# Patient Record
Sex: Male | Born: 1965
Health system: Southern US, Community
[De-identification: ages and names within clinical notes are randomized; demographics above are authoritative.]

## PROBLEM LIST (undated history)

## (undated) DIAGNOSIS — I10 Essential (primary) hypertension: Secondary | ICD-10-CM

## (undated) DIAGNOSIS — M5136 Other intervertebral disc degeneration, lumbar region: Secondary | ICD-10-CM

## (undated) DIAGNOSIS — J45909 Unspecified asthma, uncomplicated: Secondary | ICD-10-CM

## (undated) DIAGNOSIS — E781 Pure hyperglyceridemia: Secondary | ICD-10-CM

## (undated) DIAGNOSIS — G4730 Sleep apnea, unspecified: Secondary | ICD-10-CM

## (undated) DIAGNOSIS — M51369 Other intervertebral disc degeneration, lumbar region without mention of lumbar back pain or lower extremity pain: Secondary | ICD-10-CM

## (undated) DIAGNOSIS — I499 Cardiac arrhythmia, unspecified: Secondary | ICD-10-CM

## (undated) DIAGNOSIS — N200 Calculus of kidney: Secondary | ICD-10-CM

## (undated) DIAGNOSIS — L270 Generalized skin eruption due to drugs and medicaments taken internally: Secondary | ICD-10-CM

## (undated) DIAGNOSIS — E1140 Type 2 diabetes mellitus with diabetic neuropathy, unspecified: Secondary | ICD-10-CM

## (undated) DIAGNOSIS — Z9889 Other specified postprocedural states: Secondary | ICD-10-CM

## (undated) DIAGNOSIS — T7840XA Allergy, unspecified, initial encounter: Secondary | ICD-10-CM

## (undated) DIAGNOSIS — R112 Nausea with vomiting, unspecified: Secondary | ICD-10-CM

## (undated) DIAGNOSIS — F419 Anxiety disorder, unspecified: Secondary | ICD-10-CM

## (undated) DIAGNOSIS — Z87442 Personal history of urinary calculi: Secondary | ICD-10-CM

## (undated) DIAGNOSIS — K219 Gastro-esophageal reflux disease without esophagitis: Secondary | ICD-10-CM

## (undated) DIAGNOSIS — M5126 Other intervertebral disc displacement, lumbar region: Secondary | ICD-10-CM

## (undated) HISTORY — PX: WISDOM TOOTH EXTRACTION: SHX21

## (undated) HISTORY — DX: Other intervertebral disc displacement, lumbar region: M51.26

## (undated) HISTORY — DX: Gastro-esophageal reflux disease without esophagitis: K21.9

## (undated) HISTORY — DX: Sleep apnea, unspecified: G47.30

## (undated) HISTORY — DX: Generalized skin eruption due to drugs and medicaments taken internally: L27.0

## (undated) HISTORY — DX: Essential (primary) hypertension: I10

## (undated) HISTORY — DX: Allergy, unspecified, initial encounter: T78.40XA

## (undated) HISTORY — DX: Anxiety disorder, unspecified: F41.9

## (undated) HISTORY — DX: Pure hyperglyceridemia: E78.1

## (undated) HISTORY — DX: Type 2 diabetes mellitus with diabetic neuropathy, unspecified: E11.40

## (undated) HISTORY — DX: Calculus of kidney: N20.0

---

## 1999-04-18 DIAGNOSIS — E781 Pure hyperglyceridemia: Secondary | ICD-10-CM | POA: Insufficient documentation

## 2001-06-08 ENCOUNTER — Ambulatory Visit (HOSPITAL_BASED_OUTPATIENT_CLINIC_OR_DEPARTMENT_OTHER): Admission: RE | Admit: 2001-06-08 | Discharge: 2001-06-08 | Payer: Self-pay | Admitting: Family Medicine

## 2004-09-22 ENCOUNTER — Ambulatory Visit: Payer: Self-pay | Admitting: Internal Medicine

## 2005-01-03 ENCOUNTER — Ambulatory Visit: Payer: Self-pay | Admitting: Internal Medicine

## 2005-02-14 ENCOUNTER — Ambulatory Visit: Payer: Self-pay | Admitting: Internal Medicine

## 2005-08-14 ENCOUNTER — Ambulatory Visit: Payer: Self-pay | Admitting: Internal Medicine

## 2006-01-14 ENCOUNTER — Ambulatory Visit: Payer: Self-pay | Admitting: Family Medicine

## 2006-01-14 HISTORY — PX: US ECHOCARDIOGRAPHY: HXRAD669

## 2006-01-22 ENCOUNTER — Ambulatory Visit: Payer: Self-pay

## 2006-01-22 ENCOUNTER — Encounter: Payer: Self-pay | Admitting: Cardiology

## 2006-02-20 ENCOUNTER — Ambulatory Visit: Payer: Self-pay | Admitting: Internal Medicine

## 2006-07-25 DIAGNOSIS — K219 Gastro-esophageal reflux disease without esophagitis: Secondary | ICD-10-CM | POA: Insufficient documentation

## 2006-11-13 ENCOUNTER — Encounter (INDEPENDENT_AMBULATORY_CARE_PROVIDER_SITE_OTHER): Payer: Self-pay | Admitting: *Deleted

## 2006-11-28 ENCOUNTER — Ambulatory Visit: Payer: Self-pay | Admitting: Internal Medicine

## 2006-11-28 DIAGNOSIS — J301 Allergic rhinitis due to pollen: Secondary | ICD-10-CM

## 2006-11-28 DIAGNOSIS — F411 Generalized anxiety disorder: Secondary | ICD-10-CM | POA: Insufficient documentation

## 2006-11-28 DIAGNOSIS — J45909 Unspecified asthma, uncomplicated: Secondary | ICD-10-CM | POA: Insufficient documentation

## 2006-11-28 LAB — CONVERTED CEMR LAB
Cholesterol: 193 mg/dL (ref 0–200)
HDL: 30.7 mg/dL — ABNORMAL LOW (ref >39.0)
Total CHOL/HDL Ratio: 6.3
VLDL: 89 mg/dL — ABNORMAL HIGH (ref 0–40)

## 2007-08-19 ENCOUNTER — Ambulatory Visit: Payer: Self-pay | Admitting: Internal Medicine

## 2007-12-03 ENCOUNTER — Ambulatory Visit: Payer: Self-pay | Admitting: Internal Medicine

## 2007-12-03 LAB — CONVERTED CEMR LAB
Albumin: 4.4 g/dL (ref 3.5–5.2)
BUN: 14 mg/dL (ref 6–23)
Basophils Absolute: 0.1 10*3/uL (ref 0.0–0.1)
Basophils Relative: 0.7 % (ref 0.0–3.0)
Calcium: 9.5 mg/dL (ref 8.4–10.5)
Creatinine, Ser: 0.8 mg/dL (ref 0.4–1.5)
Eosinophils Absolute: 0.5 10*3/uL (ref 0.0–0.7)
Eosinophils Relative: 5.5 % — ABNORMAL HIGH (ref 0.0–5.0)
GFR calc Af Amer: 136 mL/min
HCT: 42.3 % (ref 39.0–52.0)
MCHC: 35.5 g/dL (ref 30.0–36.0)
MCV: 87.1 fL (ref 78.0–100.0)
Monocytes Absolute: 0.6 10*3/uL (ref 0.1–1.0)
Phosphorus: 3.6 mg/dL (ref 2.3–4.6)
Platelets: 230 10*3/uL (ref 150–400)
RBC: 4.85 M/uL (ref 4.22–5.81)
WBC: 8.5 10*3/uL (ref 4.5–10.5)

## 2008-07-06 ENCOUNTER — Ambulatory Visit: Payer: Self-pay | Admitting: Family Medicine

## 2008-07-06 DIAGNOSIS — M62838 Other muscle spasm: Secondary | ICD-10-CM | POA: Insufficient documentation

## 2008-07-12 ENCOUNTER — Encounter: Payer: Self-pay | Admitting: Family Medicine

## 2008-07-15 ENCOUNTER — Telehealth: Payer: Self-pay | Admitting: Family Medicine

## 2008-07-15 ENCOUNTER — Encounter: Payer: Self-pay | Admitting: Family Medicine

## 2008-07-30 ENCOUNTER — Encounter: Payer: Self-pay | Admitting: Family Medicine

## 2008-08-05 ENCOUNTER — Encounter: Payer: Self-pay | Admitting: Family Medicine

## 2008-08-10 ENCOUNTER — Ambulatory Visit: Payer: Self-pay | Admitting: Family Medicine

## 2008-08-10 DIAGNOSIS — M546 Pain in thoracic spine: Secondary | ICD-10-CM | POA: Insufficient documentation

## 2008-09-09 ENCOUNTER — Encounter: Payer: Self-pay | Admitting: Family Medicine

## 2008-12-07 ENCOUNTER — Ambulatory Visit: Payer: Self-pay | Admitting: Internal Medicine

## 2008-12-09 LAB — CONVERTED CEMR LAB
Albumin: 4.5 g/dL (ref 3.5–5.2)
Alkaline Phosphatase: 50 units/L (ref 39–117)
BUN: 14 mg/dL (ref 6–23)
Basophils Absolute: 0 10*3/uL (ref 0.0–0.1)
Calcium: 9.8 mg/dL (ref 8.4–10.5)
Creatinine, Ser: 0.9 mg/dL (ref 0.4–1.5)
Eosinophils Absolute: 0.4 10*3/uL (ref 0.0–0.7)
Glucose, Bld: 116 mg/dL — ABNORMAL HIGH (ref 70–99)
Hemoglobin: 15.2 g/dL (ref 13.0–17.0)
Lymphocytes Relative: 31.5 % (ref 12.0–46.0)
MCHC: 34.7 g/dL (ref 30.0–36.0)
Neutro Abs: 4.4 10*3/uL (ref 1.4–7.7)
Neutrophils Relative %: 55.5 % (ref 43.0–77.0)
Platelets: 198 10*3/uL (ref 150.0–400.0)
RDW: 12.5 % (ref 11.5–14.6)
TSH: 1.23 microintl units/mL (ref 0.35–5.50)
Total Bilirubin: 1.1 mg/dL (ref 0.3–1.2)

## 2009-04-14 ENCOUNTER — Encounter: Admission: RE | Admit: 2009-04-14 | Discharge: 2009-04-14 | Payer: Self-pay | Admitting: Internal Medicine

## 2009-04-14 ENCOUNTER — Ambulatory Visit: Payer: Self-pay | Admitting: Internal Medicine

## 2009-05-03 ENCOUNTER — Telehealth: Payer: Self-pay | Admitting: Internal Medicine

## 2009-12-14 ENCOUNTER — Telehealth: Payer: Self-pay | Admitting: Internal Medicine

## 2009-12-14 ENCOUNTER — Ambulatory Visit: Payer: Self-pay | Admitting: Internal Medicine

## 2009-12-14 DIAGNOSIS — B356 Tinea cruris: Secondary | ICD-10-CM

## 2009-12-15 LAB — CONVERTED CEMR LAB
ALT: 36 units/L (ref 0–53)
AST: 24 units/L (ref 0–37)
BUN: 15 mg/dL (ref 6–23)
CO2: 27 meq/L (ref 19–32)
Calcium: 9.7 mg/dL (ref 8.4–10.5)
Chloride: 103 meq/L (ref 96–112)
Eosinophils Relative: 4.2 % (ref 0.0–5.0)
GFR calc non Af Amer: 103.43 mL/min (ref >60)
HCT: 43.9 % (ref 39.0–52.0)
Hemoglobin: 15.4 g/dL (ref 13.0–17.0)
Lymphs Abs: 2.6 10*3/uL (ref 0.7–4.0)
Monocytes Relative: 6.5 % (ref 3.0–12.0)
Neutro Abs: 4.5 10*3/uL (ref 1.4–7.7)
RDW: 12.7 % (ref 11.5–14.6)
TSH: 1.52 microintl units/mL (ref 0.35–5.50)
WBC: 8.1 10*3/uL (ref 4.5–10.5)

## 2010-05-07 ENCOUNTER — Encounter: Payer: Self-pay | Admitting: Family Medicine

## 2010-05-16 NOTE — Progress Notes (Signed)
Summary: Request something for pain  Phone Note Call from Patient Call back at [phone]   Caller: Patient Call For: Joseph Salt MD Summary of Call: Patient saw you several  weeks ago with back pain and wants to know if you would prescribe him some pain managment for this condition. Patient has been taking Ibuprofen and it has caused some adverse reactions and has switched over to acetaminophen and that is not helping manage the pain.  Patient has tried his wife's Tramadol and it has helped some. [facility] Initial call taken by: Sydell Axon LPN,  May 03, 2009 1:05 PM  Follow-up for Phone Call        okay to send Rx for tramadol 50mg   #60 x 0 1 three times a day as needed for back pain If ongoing problems, should schedule appt with me or Dr Patsy Lager Follow-up by: Joseph Salt MD,  May 03, 2009 1:39 PM  Additional Follow-up for Phone Call Additional follow up Details #1::        lmom that rx was sent to pharmacy and that pt should schedule follow-up if needed. Additional Follow-up by: Mervin Hack CMA Duncan Dull),  May 03, 2009 4:12 PM    New/Updated Medications: TRAMADOL HCL 50 MG TABS (TRAMADOL HCL) take 1 by mouth three times a day as needed pain Prescriptions: TRAMADOL HCL 50 MG TABS (TRAMADOL HCL) take 1 by mouth three times a day as needed pain  #60 x 0   Entered by:   Mervin Hack CMA (AAMA)   Authorized by:   Joseph Salt MD   Signed by:   Mervin Hack CMA (AAMA) on 05/03/2009   Method used:   Electronically to        [facility]* (retail)       [address]       [place], [place]  [address]       Ph: [phone]       Fax: [phone]   RxID:   [id number]

## 2010-05-16 NOTE — Assessment & Plan Note (Signed)
Summary: CPX/CLE R/S FROM 12/09/09   Vital Signs:  Patient profile:   45 year old male Height:      73 inches Weight:      228 pounds BMI:     30.19 Temp:     98.3 degrees F oral Pulse rate:   64 / minute Pulse rhythm:   regular BP sitting:   120 / 80  (left arm) Cuff size:   large  Vitals Entered By: Mervin Hack CMA Duncan Dull) (December 14, 2009 10:16 AM) CC: adult physical   History of Present Illness: Doing okay in general  Does get episodic back pain gets relief from tramadol but doesn't use often Uses the cyclobenzaprine is he gets spasm has been trying to do stretches that the PT had taught him  Discussed walking also  Aunt had systemic fungal infection worries because he has recurrent skin problems groin and feet had cleared with lamisil in the past  Sister had been missing for 16 months and was found last fall Her remains had finally been found Suicide form overdose of sleeping pills  Allergies: 1)  ! Bufferin (Aspirin Buf(Cacarb-Mgcarb-Mgo))  Past History:  Past medical, surgical, family and social histories (including risk factors) reviewed for relevance to current acute and chronic problems.  Past Medical History: Reviewed history from 11/28/2006 and no changes required. Hypertriglyceridemia Sleep apnea GERD Fixed drug eruption--11/07   (cold meds?) Allergic rhinitis Anxiety  Past Surgical History: Reviewed history from 07/25/2006 and no changes required. Echo- normal -Holter benign 23-Jan-2023  Family History: Mom died ovarian cancer @66  (2009) Dad had stroke affecting optic nerve 2 sisters-- 1 comitted suicide No CAD,cancer, DM  Social History: Reviewed history from 07/25/2006 and no changes required. 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 Eyes:  Denies double vision  and vision loss-1 eye. ENT:  Complains of ringing in ears; denies decreased hearing; chronic tinnitus teeth okay--sees dentist. CV:  Complains of shortness of breath with exertion; denies chest pain or discomfort, difficulty breathing at night, difficulty breathing while lying down, fainting, lightheadness, and palpitations; no palpitatioins since off NSAIDs. Resp:  Complains of shortness of breath; denies cough; gets exercise induced cough and SOB----will give Rx for inhaler. GI:  Complains of change in bowel habits and indigestion; denies bloody stools, dark tarry stools, nausea, and vomiting; flares of irritable bowel with cramping and diarrhea Protonix controls heartburn. GU:  Denies erectile dysfunction, urinary frequency, and urinary hesitancy. MS:  Complains of low back pain and mid back pain; denies joint pain and joint swelling. Derm:  Complains of rash; denies lesion(s). Neuro:  Complains of headaches; denies numbness, tingling, and weakness; occ migraines---uses excedrin product (no reaction to that). Psych:  Complains of anxiety; denies depression; depression seems better only occ anxiety spells. Heme:  Denies abnormal bruising and enlarge lymph nodes. Allergy:  Denies seasonal allergies and sneezing; uses meds as needed .  Physical Exam  General:  alert and normal appearance.   Eyes:  pupils equal, pupils round, pupils reactive to light, and no optic disk abnormalities.   Ears:  R ear normal and L ear normal.   Mouth:  no erythema, no exudates, and no lesions.   Neck:  supple, no masses, no thyromegaly, no carotid bruits, and no cervical lymphadenopathy.   Lungs:  normal respiratory effort, no intercostal retractions, no  accessory muscle use, and normal breath sounds.   Heart:  normal rate, regular rhythm, no murmur, and no gallop.   Abdomen:  soft and non-tender.   Msk:  no joint tenderness and no joint swelling.   Pulses:  1+ in feet Extremities:  no edema Neurologic:   alert & oriented X3, strength normal in all extremities, and gait normal.   Skin:  no suspicious lesions and no ulcerations.   Mild intertrigo in inguinal area Axillary Nodes:  No palpable lymphadenopathy Psych:  normally interactive, good eye contact, not anxious appearing, and not depressed appearing.     Impression & Recommendations:  Problem # 1:  HEALTH MAINTENANCE EXAM (ICD-V70.0) Assessment Comment Only discussed fitness cancer screening at 50  Problem # 2:  ANXIETY (ICD-300.00) Assessment: Improved doing some better  Problem # 3:  ALLERGIC RHINITIS (ICD-477.9) Assessment: Comment Only does okay with meds  inhaler for before exercise  His updated medication list for this problem includes:    Fexofenadine Hcl 180 Mg Tabs (Fexofenadine hcl) .Marland Kitchen... 1 daily as needed for allergies    Fluticasone Propionate 50 Mcg/act Susp (Fluticasone propionate) .Marland Kitchen... 2 sprays each nostril two times a day for 1 week then daily  Problem # 4:  TINEA CRURIS (ICD-110.3) Assessment: New has had in past did well with brief course of terbenafine  Problem # 5:  GERD (ICD-530.81) Assessment: Comment Only does fine with the med  His updated medication list for this problem includes:    Protonix 40 Mg Tbec (Pantoprazole sodium) .Marland Kitchen... Take 1 tablet by mouth once a day as needed  Orders: TLB-Renal Function Panel (80069-RENAL) TLB-CBC Platelet - w/Differential (85025-CBCD) TLB-Hepatic/Liver Function Pnl (80076-HEPATIC) TLB-TSH (Thyroid Stimulating Hormone) (84443-TSH) Venipuncture (13244)  Complete Medication List: 1)  Protonix 40 Mg Tbec (Pantoprazole sodium) .... Take 1 tablet by mouth once a day as needed 2)  Cyclobenzaprine Hcl 10 Mg Tabs (Cyclobenzaprine hcl) .... 1/2 - 1  tab at bedtime as needed for muscle spasm 3)  Tramadol Hcl 50 Mg Tabs (Tramadol hcl) .... Take 1 by mouth three times a day as needed pain 4)  Fexofenadine Hcl 180 Mg Tabs (Fexofenadine hcl) .Marland Kitchen.. 1 daily as needed for  allergies 5)  Fluticasone Propionate 50 Mcg/act Susp (Fluticasone propionate) .... 2 sprays each nostril two times a day for 1 week then daily 6)  Ventolin Hfa 108 (90 Base) Mcg/act Aers (Albuterol sulfate) .... 2 puffs before exercise or cold exposure 7)  Terbinafine Hcl 250 Mg Tabs (Terbinafine hcl) .Marland Kitchen.. 1 tab daily for 1 week for yeast infection. repeat if needed  Patient Instructions: 1)  Please schedule a follow-up appointment in 1 year.  Prescriptions: TERBINAFINE HCL 250 MG TABS (TERBINAFINE HCL) 1 tab daily for 1 week for yeast infection. Repeat if needed  #28 x 0   Entered and Authorized by:   Cindee Salt MD   Signed by:   Cindee Salt MD on 12/14/2009   Method used:   Electronically to        [facility]* (retail)       [address]       [place], [place]  [address]       Ph: [phone]       Fax: [phone]   RxID:   [id number] VENTOLIN HFA 108 (90 BASE) MCG/ACT AERS (ALBUTEROL SULFATE) 2 puffs before exercise or cold exposure  #1 x 1   Entered and Authorized by:   Cindee Salt MD   Signed by:  Cindee Salt MD on 12/14/2009   Method used:   Electronically to        [facility]* (retail)       [address]       [place], [place]  [address]       Ph: [phone]       Fax: [phone]   RxID:   [id number] TRAMADOL HCL 50 MG TABS (TRAMADOL HCL) take 1 by mouth three times a day as needed pain  #60 x 0   Entered and Authorized by:   Cindee Salt MD   Signed by:   Cindee Salt MD on 12/14/2009   Method used:   Electronically to        [facility]* (retail)       [address]       [place], [place]  [address]       Ph: [phone]       Fax: [phone]   RxID:   [id number] CYCLOBENZAPRINE HCL 10 MG TABS (CYCLOBENZAPRINE HCL) 1/2 - 1  tab at bedtime as needed for muscle spasm  #30 x 1   Entered and Authorized by:   Cindee Salt MD   Signed by:   Cindee Salt MD on 12/14/2009   Method used:    Electronically to        [facility]* (retail)       [address]       [place], [place]  [address]       Ph: [phone]       Fax: [phone]   RxID:   [id number] FLUTICASONE PROPIONATE 50 MCG/ACT  SUSP (FLUTICASONE PROPIONATE) 2 sprays each nostril two times a day for 1 week then daily  #16 Gram x 11   Entered by:   Mervin Hack CMA (AAMA)   Authorized by:   Cindee Salt MD   Signed by:   Mervin Hack CMA (AAMA) on 12/14/2009   Method used:   Electronically to        [facility]* (retail)       [address]       [place], [place]  [address]       Ph: [phone]       Fax: [phone]   RxID:   [id number] PROTONIX 40 MG  TBEC (PANTOPRAZOLE SODIUM) Take 1 tablet by mouth once a day as needed  #30 x 12   Entered by:   Mervin Hack CMA (AAMA)   Authorized by:   Cindee Salt MD   Signed by:   Mervin Hack CMA (AAMA) on 12/14/2009   Method used:   Electronically to        [facility]* (retail)       [address]       [place], [place]  [address]       Ph: [phone]       Fax: [phone]   RxID:   [id number]   Current Allergies (reviewed today): ! BUFFERIN (ASPIRIN BUF(CACARB-MGCARB-MGO))

## 2010-05-16 NOTE — Progress Notes (Signed)
Summary: prior auth needed for terbenafine  Phone Note From Pharmacy   Caller: [facility]* Summary of Call: Prior auth is required for terbinafine, but pt can get this from [facility] $4.00 list for a months supply, or $10.00 for a 3 month supply.  He has opted to go that route.  Please advise on instructions and quantity.  Uses [facility]. Initial call taken by: Lowella Petties CMA,  December 14, 2009 3:42 PM  Follow-up for Phone Call        Please send Rx for #30 x 0 take daily for 1 week for fungal infection. Repeat as needed  Follow-up by: Cindee Salt MD,  December 14, 2009 6:00 PM  Additional Follow-up for Phone Call Additional follow up Details #1::        rx sent to pharmacy, Spoke with patient and advised results.  Additional Follow-up by: Mervin Hack CMA (AAMA),  December 15, 2009 8:20 AM    New/Updated Medications: TERBINAFINE HCL 250 MG TABS (TERBINAFINE HCL) 1 tab daily for 1 week for yeast infection. Repeat if needed Prescriptions: TERBINAFINE HCL 250 MG TABS (TERBINAFINE HCL) 1 tab daily for 1 week for yeast infection. Repeat if needed  #0 x 0   Entered by:   Mervin Hack CMA (AAMA)   Authorized by:   Cindee Salt MD   Signed by:   Mervin Hack CMA (AAMA) on 12/15/2009   Method used:   Electronically to        [facility] (retail)       [address]       [place], [place]  [address]       Ph: [phone]       Fax: [phone]   RxID:   [id number]

## 2011-07-31 ENCOUNTER — Other Ambulatory Visit: Payer: Self-pay

## 2011-07-31 NOTE — Telephone Encounter (Signed)
Spoke with patient and he will buy OTC, pt has no insurance.

## 2011-07-31 NOTE — Telephone Encounter (Signed)
Pt walked in and left written request for refill Fexofenadine HCL 180 mg. with instructions  1 tab by mouth daily as needed. Pt last seen 12/14/09 and no future appt scheduled. Pt uses [facility] and can be reached at [phone].

## 2011-08-09 ENCOUNTER — Other Ambulatory Visit: Payer: Self-pay | Admitting: *Deleted

## 2011-08-09 NOTE — Telephone Encounter (Signed)
Patient last seen 12/14/09 for CPX.  Faxed request from [facility].

## 2011-08-10 MED ORDER — FLUTICASONE PROPIONATE 50 MCG/ACT NA SUSP: 2.0000 | Freq: Every day | NASAL | Status: DC

## 2011-08-10 MED ORDER — TRAMADOL HCL 50 MG PO TABS: 50.0000 mg | ORAL_TABLET | Freq: Three times a day (TID) | ORAL | Status: AC | PRN

## 2011-08-10 NOTE — Telephone Encounter (Signed)
Okay fluticasone for a year and tramadol #30 x 0 Have him schedule a PE in the next few months

## 2011-08-10 NOTE — Telephone Encounter (Signed)
rx refilled as instructed and message left for patient to schedule cpx

## 2012-01-07 ENCOUNTER — Encounter: Payer: Self-pay | Admitting: Internal Medicine

## 2012-01-07 ENCOUNTER — Ambulatory Visit (INDEPENDENT_AMBULATORY_CARE_PROVIDER_SITE_OTHER): Payer: BC Managed Care – PPO | Admitting: Internal Medicine

## 2012-01-07 VITALS — BP 130/90 | HR 73 | Temp 98.6°F | Ht 73.5 in | Wt 233.0 lb

## 2012-01-07 DIAGNOSIS — R200 Anesthesia of skin: Secondary | ICD-10-CM | POA: Insufficient documentation

## 2012-01-07 DIAGNOSIS — B360 Pityriasis versicolor: Secondary | ICD-10-CM

## 2012-01-07 DIAGNOSIS — Z0000 Encounter for general adult medical examination without abnormal findings: Secondary | ICD-10-CM | POA: Insufficient documentation

## 2012-01-07 DIAGNOSIS — E781 Pure hyperglyceridemia: Secondary | ICD-10-CM

## 2012-01-07 DIAGNOSIS — K219 Gastro-esophageal reflux disease without esophagitis: Secondary | ICD-10-CM

## 2012-01-07 DIAGNOSIS — R209 Unspecified disturbances of skin sensation: Secondary | ICD-10-CM

## 2012-01-07 LAB — CBC WITH DIFFERENTIAL/PLATELET
Eosinophils Relative: 4.6 % (ref 0.0–5.0)
HCT: 45.5 % (ref 39.0–52.0)
Lymphs Abs: 3.1 10*3/uL (ref 0.7–4.0)
MCV: 87.8 fl (ref 78.0–100.0)
Monocytes Absolute: 0.7 10*3/uL (ref 0.1–1.0)
Platelets: 222 10*3/uL (ref 150.0–400.0)
RDW: 12.8 % (ref 11.5–14.6)
WBC: 9.8 10*3/uL (ref 4.5–10.5)

## 2012-01-07 MED ORDER — SELENIUM SULFIDE 2.5 % EX LOTN: TOPICAL_LOTION | CUTANEOUS | Status: DC

## 2012-01-07 MED ORDER — PANTOPRAZOLE SODIUM 40 MG PO TBEC: 40.0000 mg | DELAYED_RELEASE_TABLET | Freq: Every day | ORAL | Status: DC | PRN

## 2012-01-07 NOTE — Assessment & Plan Note (Signed)
Non specific Will check labs 

## 2012-01-07 NOTE — Assessment & Plan Note (Signed)
Rx for selsun lotion

## 2012-01-07 NOTE — Assessment & Plan Note (Signed)
Generally healthy but needs to work on fitness Discussed losing weight

## 2012-01-07 NOTE — Progress Notes (Signed)
Subjective:    Patient ID: Joseph Jackson, male    DOB: 02-07-1966, 46 y.o.   MRN: [id number]  HPI "It has been an interesting 2 years"  Almost ruptured tendon at right elbow---seen at urgent care (for worker's comp) Still with some swelling after a year  Palms of hands and arches in feet are sore in AM Hurts at first to walk --improves as time goes by Has sensory loss on tops of feet as well  Has rash across chest and back Concerned about psoriasis (did have prior diagnosis at derm)  Lost voice for 9 months Pain on side of throat Voice would be okay and then fade after a few minutes and be hoarse Coughed or sneezed out some tissue---seemed to get better since then No recent major heartburn issues---uses protonix prn  Still gets periodic back pain Uses ibuprofen but occ uses the tramadol (will tend to do this in spurts)  Current Outpatient Prescriptions on File Prior to Visit  Medication Sig Dispense Refill  . fluticasone (FLONASE) 50 MCG/ACT nasal spray Place 2 sprays into the nose daily.  16 g  11  . pantoprazole (PROTONIX) 40 MG tablet Take 1 tablet (40 mg total) by mouth daily as needed.  30 tablet  11    Allergies  Allergen Reactions  . Aspirin     REACTION: swelling    Past Medical History  Diagnosis Date  . Pure hyperglyceridemia   . Unspecified sleep apnea   . GERD (gastroesophageal reflux disease)   . Dermatitis due to drugs and medicines taken internally   . Allergy   . Anxiety     Past Surgical History  Procedure Date  . US echocardiography 10/07    normal, holter benign     Family History  Problem Relation Age of Onset  . Cancer Neg Hx   . Diabetes Neg Hx   . Heart disease Neg Hx     History   Social History  . Marital Status: Married    Spouse Name: N/A    Number of Children: 2  . 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 Smoking status: Never Smoker   .  Smokeless tobacco: Never Used  . Alcohol Use: Yes  . Drug Use: No  . Sexually Active: Not on file   Other Topics Concern  . Not on file   Social History Narrative  . No narrative on file   Review of Systems  Constitutional: Positive for unexpected weight change. Negative for fatigue.       Weight is up 5# Wears seat belt  HENT: Positive for congestion, rhinorrhea and tinnitus. Negative for hearing loss and dental problem.        Regular with dentist Uses flonase for his allergies. OTC fexofenadine also  Eyes: Negative for visual disturbance.       No diplopia or unilateral vision loss  Respiratory: Positive for shortness of breath. Negative for cough and chest tightness.        Occ has allergic asthma---has left over inhaler. Will use that proactively if exerts in the cold weather  Cardiovascular: Negative for chest pain, palpitations and leg swelling.  Gastrointestinal: Positive for diarrhea and constipation. Negative for nausea, vomiting, abdominal pain and blood in stool.       Some heartburn---see HPI Gets bouts of IBS  Genitourinary: Negative for dysuria, urgency, frequency and difficulty urinating.  No sexual problems  Musculoskeletal: Positive for back pain. Negative for joint swelling and arthralgias.  Skin: Positive for color change. Negative for rash.  Neurological: Positive for numbness. Negative for dizziness, syncope, weakness, light-headedness and headaches.  Hematological: Positive for adenopathy. Does not bruise/bleed easily.       Intermittent cervical nodes  Psychiatric/Behavioral: Positive for disturbed wake/sleep cycle and dysphoric mood. The patient is nervous/anxious.        Has restless sleep at times--snores but no apnea Occ down day with the days getting shorter Does get anxious at times in response to life stressors       Objective:   Physical Exam  Constitutional: He is oriented to person, place, and time. He appears well-developed and  well-nourished. No distress.  HENT:  Head: Normocephalic and atraumatic.  Right Ear: External ear normal.  Left Ear: External ear normal.  Mouth/Throat: Oropharynx is clear and moist. No oropharyngeal exudate.  Eyes: Conjunctivae normal and EOM are normal. Pupils are equal, round, and reactive to light.  Neck: Normal range of motion. Neck supple. No thyromegaly present.  Cardiovascular: Normal rate, regular rhythm, normal heart sounds and intact distal pulses.  Exam reveals no gallop.   No murmur heard. Pulmonary/Chest: Effort normal and breath sounds normal. No respiratory distress. He has no wheezes. He has no rales.  Abdominal: Soft. There is no tenderness.  Musculoskeletal: Normal range of motion. He exhibits no edema and no tenderness.  Lymphadenopathy:    He has no cervical adenopathy.  Neurological: He is alert and oriented to person, place, and time.  Skin: Skin is warm. Rash noted. No erythema. No pallor.       Faint erythematous rash across chest  Psychiatric: He has a normal mood and affect. His behavior is normal. Thought content normal.          Assessment & Plan:

## 2012-01-07 NOTE — Assessment & Plan Note (Signed)
protonix prn  

## 2012-01-08 LAB — BASIC METABOLIC PANEL
BUN: 15 mg/dL (ref 6–23)
Chloride: 105 mEq/L (ref 96–112)
Glucose, Bld: 89 mg/dL (ref 70–99)
Potassium: 4.2 mEq/L (ref 3.5–5.1)

## 2012-01-08 LAB — VITAMIN B12: Vitamin B-12: 506 pg/mL (ref 211–911)

## 2012-01-08 LAB — T4, FREE: Free T4: 0.87 ng/dL (ref 0.60–1.60)

## 2012-01-08 LAB — HEPATIC FUNCTION PANEL
AST: 26 U/L (ref 0–37)
Albumin: 4.9 g/dL (ref 3.5–5.2)
Total Protein: 8 g/dL (ref 6.0–8.3)

## 2012-01-08 LAB — TSH: TSH: 1.58 u[IU]/mL (ref 0.35–5.50)

## 2012-01-14 ENCOUNTER — Encounter: Payer: Self-pay | Admitting: *Deleted

## 2012-02-20 ENCOUNTER — Other Ambulatory Visit: Payer: Self-pay | Admitting: *Deleted

## 2012-02-20 MED ORDER — TRAMADOL HCL 50 MG PO TABS: ORAL_TABLET | ORAL | Status: DC

## 2012-02-20 NOTE — Telephone Encounter (Signed)
Should be tid prn cough #60 x 0

## 2012-02-20 NOTE — Telephone Encounter (Signed)
Medication phoned to [facility] pharmacy as instructed.

## 2012-02-28 ENCOUNTER — Other Ambulatory Visit: Payer: Self-pay | Admitting: *Deleted

## 2012-02-28 NOTE — Telephone Encounter (Signed)
Last filled 02/20/2012, too soon?

## 2012-02-28 NOTE — Telephone Encounter (Signed)
Find out how much he is using Has seen pulmonary for chronic cough that has defied diagnosis Uses this as needed If he is using it regularly, can increase to #90 ----but he still shouldn't have used up the 60 yet  Check with him

## 2012-02-28 NOTE — Telephone Encounter (Signed)
Patient picked up rx yesterday 02/27/2012

## 2012-05-31 ENCOUNTER — Other Ambulatory Visit: Payer: Self-pay

## 2012-12-01 ENCOUNTER — Other Ambulatory Visit: Payer: Self-pay | Admitting: Internal Medicine

## 2012-12-01 NOTE — Telephone Encounter (Signed)
Okay #60 x 0 Due for physical in September Have him schedule within the next 4 months or so

## 2012-12-02 NOTE — Telephone Encounter (Signed)
rx called into pharmacy Pt has CPX on 01/08/13

## 2013-01-08 ENCOUNTER — Encounter: Payer: BC Managed Care – PPO | Admitting: Internal Medicine

## 2013-01-08 DIAGNOSIS — Z0289 Encounter for other administrative examinations: Secondary | ICD-10-CM

## 2013-01-22 ENCOUNTER — Telehealth: Payer: Self-pay | Admitting: Internal Medicine

## 2013-01-22 NOTE — Telephone Encounter (Signed)
That sounds great! She has had a rough time lately, and him also because of that. Joseph Jackson may really be able to help her

## 2013-01-22 NOTE — Telephone Encounter (Signed)
The referral was for Joseph Jackson also! I made his wife an appt and he asked me to refer him also, I will try to ask Terri about a family session with their twin boys also.

## 2013-01-22 NOTE — Telephone Encounter (Signed)
Patient came by with his wife and asked if I could refer him to [facility]ick so I made him an appointment and told him I would let you know. Also he set up an appt to see you soon.

## 2013-01-22 NOTE — Telephone Encounter (Signed)
Okay. Sounds good.

## 2013-02-05 ENCOUNTER — Ambulatory Visit (INDEPENDENT_AMBULATORY_CARE_PROVIDER_SITE_OTHER): Payer: PRIVATE HEALTH INSURANCE | Admitting: Psychology

## 2013-02-05 DIAGNOSIS — F4321 Adjustment disorder with depressed mood: Secondary | ICD-10-CM

## 2013-02-12 ENCOUNTER — Ambulatory Visit (INDEPENDENT_AMBULATORY_CARE_PROVIDER_SITE_OTHER): Payer: PRIVATE HEALTH INSURANCE | Admitting: Psychology

## 2013-02-12 DIAGNOSIS — F4321 Adjustment disorder with depressed mood: Secondary | ICD-10-CM

## 2013-02-26 ENCOUNTER — Ambulatory Visit (INDEPENDENT_AMBULATORY_CARE_PROVIDER_SITE_OTHER): Payer: PRIVATE HEALTH INSURANCE | Admitting: Psychology

## 2013-02-26 DIAGNOSIS — F4321 Adjustment disorder with depressed mood: Secondary | ICD-10-CM

## 2013-03-05 ENCOUNTER — Ambulatory Visit (INDEPENDENT_AMBULATORY_CARE_PROVIDER_SITE_OTHER): Payer: PRIVATE HEALTH INSURANCE | Admitting: Psychology

## 2013-03-05 DIAGNOSIS — F4321 Adjustment disorder with depressed mood: Secondary | ICD-10-CM

## 2013-03-19 ENCOUNTER — Ambulatory Visit: Payer: BC Managed Care – PPO | Admitting: Psychology

## 2013-03-23 ENCOUNTER — Encounter: Payer: BC Managed Care – PPO | Admitting: Internal Medicine

## 2013-03-24 ENCOUNTER — Encounter: Payer: Self-pay | Admitting: Internal Medicine

## 2013-03-24 ENCOUNTER — Ambulatory Visit (INDEPENDENT_AMBULATORY_CARE_PROVIDER_SITE_OTHER): Payer: BC Managed Care – PPO | Admitting: Internal Medicine

## 2013-03-24 VITALS — BP 138/80 | HR 72 | Temp 97.9°F | Wt 232.0 lb

## 2013-03-24 DIAGNOSIS — E781 Pure hyperglyceridemia: Secondary | ICD-10-CM

## 2013-03-24 DIAGNOSIS — J309 Allergic rhinitis, unspecified: Secondary | ICD-10-CM

## 2013-03-24 DIAGNOSIS — K219 Gastro-esophageal reflux disease without esophagitis: Secondary | ICD-10-CM

## 2013-03-24 DIAGNOSIS — Z0000 Encounter for general adult medical examination without abnormal findings: Secondary | ICD-10-CM

## 2013-03-24 LAB — HEPATIC FUNCTION PANEL
ALT: 24 U/L (ref 0–53)
AST: 20 U/L (ref 0–37)
Total Protein: 7.7 g/dL (ref 6.0–8.3)

## 2013-03-24 LAB — CBC WITH DIFFERENTIAL/PLATELET
Basophils Relative: 1.2 % (ref 0.0–3.0)
Eosinophils Absolute: 0.4 10*3/uL (ref 0.0–0.7)
Eosinophils Relative: 4.1 % (ref 0.0–5.0)
HCT: 43.7 % (ref 39.0–52.0)
Hemoglobin: 15 g/dL (ref 13.0–17.0)
Lymphs Abs: 3.1 10*3/uL (ref 0.7–4.0)
MCHC: 34.3 g/dL (ref 30.0–36.0)
MCV: 86 fl (ref 78.0–100.0)
Monocytes Absolute: 0.6 10*3/uL (ref 0.1–1.0)
Neutro Abs: 5.3 10*3/uL (ref 1.4–7.7)
RBC: 5.09 Mil/uL (ref 4.22–5.81)

## 2013-03-24 LAB — LIPID PANEL
Cholesterol: 198 mg/dL (ref 0–200)
Total CHOL/HDL Ratio: 7

## 2013-03-24 LAB — BASIC METABOLIC PANEL
CO2: 26 mEq/L (ref 19–32)
Chloride: 101 mEq/L (ref 96–112)
Creatinine, Ser: 0.9 mg/dL (ref 0.4–1.5)
Potassium: 4.3 mEq/L (ref 3.5–5.1)
Sodium: 134 mEq/L — ABNORMAL LOW (ref 135–145)

## 2013-03-24 LAB — LDL CHOLESTEROL, DIRECT: Direct LDL: 122.5 mg/dL

## 2013-03-24 NOTE — Assessment & Plan Note (Signed)
Needs to work on fitness Will recheck

## 2013-03-24 NOTE — Assessment & Plan Note (Signed)
Controlled with PPI

## 2013-03-24 NOTE — Patient Instructions (Signed)
DASH Diet  The DASH diet stands for "Dietary Approaches to Stop Hypertension." It is a healthy eating plan that has been shown to reduce high blood pressure (hypertension) in as little as 14 days, while also possibly providing other significant health benefits. These other health benefits include reducing the risk of breast cancer after menopause and reducing the risk of type 2 diabetes, heart disease, colon cancer, and stroke. Health benefits also include weight loss and slowing kidney failure in patients with chronic kidney disease.   DIET GUIDELINES  · Limit salt (sodium). Your diet should contain less than 1500 mg of sodium daily.  · Limit refined or processed carbohydrates. Your diet should include mostly whole grains. Desserts and added sugars should be used sparingly.  · Include small amounts of heart-healthy fats. These types of fats include nuts, oils, and tub margarine. Limit saturated and trans fats. These fats have been shown to be harmful in the body.  CHOOSING FOODS   The following food groups are based on a 2000 calorie diet. See your Registered Dietitian for individual calorie needs.  Grains and Grain Products (6 to 8 servings daily)  · Eat More Often: Whole-wheat bread, brown rice, whole-grain or wheat pasta, quinoa, popcorn without added fat or salt (air popped).  · Eat Less Often: White bread, white pasta, white rice, cornbread.  Vegetables (4 to 5 servings daily)  · Eat More Often: Fresh, frozen, and canned vegetables. Vegetables may be raw, steamed, roasted, or grilled with a minimal amount of fat.  · Eat Less Often/Avoid: Creamed or fried vegetables. Vegetables in a cheese sauce.  Fruit (4 to 5 servings daily)  · Eat More Often: All fresh, canned (in natural juice), or frozen fruits. Dried fruits without added sugar. One hundred percent fruit juice (½ cup [237 mL] daily).  · Eat Less Often: Dried fruits with added sugar. Canned fruit in light or heavy syrup.  Lean Meats, Fish, and Poultry (2  servings or less daily. One serving is 3 to 4 oz [85-114 g]).  · Eat More Often: Ninety percent or leaner ground beef, tenderloin, sirloin. Round cuts of beef, chicken breast, turkey breast. All fish. Grill, bake, or broil your meat. Nothing should be fried.  · Eat Less Often/Avoid: Fatty cuts of meat, turkey, or chicken leg, thigh, or wing. Fried cuts of meat or fish.  Dairy (2 to 3 servings)  · Eat More Often: Low-fat or fat-free milk, low-fat plain or light yogurt, reduced-fat or part-skim cheese.  · Eat Less Often/Avoid: Milk (whole, 2%). Whole milk yogurt. Full-fat cheeses.  Nuts, Seeds, and Legumes (4 to 5 servings per week)  · Eat More Often: All without added salt.  · Eat Less Often/Avoid: Salted nuts and seeds, canned beans with added salt.  Fats and Sweets (limited)  · Eat More Often: Vegetable oils, tub margarines without trans fats, sugar-free gelatin. Mayonnaise and salad dressings.  · Eat Less Often/Avoid: Coconut oils, palm oils, butter, stick margarine, cream, half and half, cookies, candy, pie.  FOR MORE INFORMATION  The Dash Diet Eating Plan: www.dashdiet.org  Document Released: 03/22/2011 Document Revised: 06/25/2011 Document Reviewed: 03/22/2011  Exit[employer]® Patient Information ©2014 [employer].

## 2013-03-24 NOTE — Assessment & Plan Note (Signed)
Healthy but out of shape Discussed fitness Will check labs

## 2013-03-24 NOTE — Assessment & Plan Note (Signed)
Okay with fexofenadine and flonase

## 2013-03-24 NOTE — Progress Notes (Signed)
Subjective:    Patient ID: Joseph Jackson, male    DOB: 1966/02/06, 47 y.o.   MRN: [id number]  HPI Here for physical Lots of stress recently---wife tried suicide attempt and was hospitalized. Now has some medical issues as well  Only physical issues is his back---mid and lower back Has had PT in past Now doing home therapy ---inversion table Discussed planks Does use tramadol for pain occasionally--like 3 times a week  Has anxiety and stress due to family situation  Current Outpatient Prescriptions on File Prior to Visit  Medication Sig Dispense Refill  . albuterol (PROVENTIL HFA;VENTOLIN HFA) 108 (90 BASE) MCG/ACT inhaler Inhale 2 puffs into the lungs 3 (three) times daily as needed.      . pantoprazole (PROTONIX) 40 MG tablet Take 1 tablet (40 mg total) by mouth daily as needed.  30 tablet  11   No current facility-administered medications on file prior to visit.    Allergies  Allergen Reactions  . Aspirin     REACTION: swelling    Past Medical History  Diagnosis Date  . Pure hyperglyceridemia   . Unspecified sleep apnea   . GERD (gastroesophageal reflux disease)   . Dermatitis due to drugs and medicines taken internally(693.0)   . Allergy   . Anxiety     Past Surgical History  Procedure Laterality Date  . US echocardiography  10/07    normal, holter benign     Family History  Problem Relation Age of Onset  . Cancer Neg Hx   . Diabetes Neg Hx   . Heart disease Neg Hx     History   Social History  . Marital Status: Married    Spouse Name: N/A    Number of Children: 2  . 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 Smoking status: Never Smoker   . Smokeless tobacco: Never Used  . Alcohol Use: Yes  . Drug Use: No  . Sexual Activity: Not on file   Other Topics Concern  . Not on file   Social History Narrative  . No narrative on file   Review of Systems  Constitutional:  Negative for fatigue and unexpected weight change.       Wears seat belt  HENT: Positive for congestion, dental problem, rhinorrhea and tinnitus. Negative for hearing loss.        Uses flonase and OTC fexofenadine for allergies Regular with dentist--teeth are sensitive  Eyes: Negative for visual disturbance.       No diplopia or unilateral vision loss  Respiratory: Positive for wheezing. Negative for cough, chest tightness and shortness of breath.        Rare wheezing--will use inhaler before strenuous exercise in the cold  Cardiovascular: Positive for palpitations. Negative for chest pain and leg swelling.       Palps after NSAIDs  Gastrointestinal: Positive for diarrhea. Negative for nausea, vomiting, abdominal pain, constipation and blood in stool.       Lots of gas--- avoids lactose. Discussed sorbitol Heartburn controlled Rare diarrhea  Endocrine: Negative for cold intolerance and heat intolerance.  Genitourinary: Negative for urgency, frequency and difficulty urinating.       Anxiety has affected his potency  Musculoskeletal: Positive for back pain. Negative for arthralgias and joint swelling.  Skin: Negative for rash.       Rash resolved-actually uses Head and Shoulders  Allergic/Immunologic: Positive for environmental allergies.  Negative for immunocompromised state.  Neurological: Positive for headaches. Negative for dizziness, syncope, weakness, light-headedness and numbness.       Occasional migraines---heat brings it on  Hematological: Negative for adenopathy. Does not bruise/bleed easily.  Psychiatric/Behavioral: Positive for sleep disturbance and dysphoric mood. The patient is nervous/anxious.        Restless sleeper---?related to back pain Has CPAP but doesn't seem to be able to use effectively Some bouts of mood problems --another friend died recently. He fights this       Objective:   Physical Exam  Constitutional: He is oriented to person, place, and time. He  appears well-developed and well-nourished. No distress.  HENT:  Head: Normocephalic and atraumatic.  Right Ear: External ear normal.  Left Ear: External ear normal.  Mouth/Throat: Oropharynx is clear and moist. No oropharyngeal exudate.  Eyes: Conjunctivae and EOM are normal. Pupils are equal, round, and reactive to light.  Neck: Normal range of motion. Neck supple. No thyromegaly present.  Cardiovascular: Normal rate, regular rhythm, normal heart sounds and intact distal pulses.  Exam reveals no gallop.   No murmur heard. Pulmonary/Chest: Effort normal and breath sounds normal. No respiratory distress. He has no wheezes. He has no rales.  Abdominal: Soft. There is no tenderness.  Musculoskeletal: He exhibits no edema and no tenderness.  Lymphadenopathy:    He has no cervical adenopathy.  Neurological: He is alert and oriented to person, place, and time.  Skin: No rash noted. No erythema.  Psychiatric: He has a normal mood and affect. His behavior is normal.          Assessment & Plan:

## 2013-03-26 ENCOUNTER — Ambulatory Visit (INDEPENDENT_AMBULATORY_CARE_PROVIDER_SITE_OTHER): Payer: PRIVATE HEALTH INSURANCE | Admitting: Psychology

## 2013-03-26 DIAGNOSIS — F4321 Adjustment disorder with depressed mood: Secondary | ICD-10-CM

## 2013-07-11 ENCOUNTER — Other Ambulatory Visit: Payer: Self-pay | Admitting: Internal Medicine

## 2013-07-13 NOTE — Telephone Encounter (Signed)
Okay #[id number] x 0 

## 2013-07-13 NOTE — Telephone Encounter (Signed)
12/01/2012 

## 2013-07-14 NOTE — Telephone Encounter (Signed)
Approved as noted 

## 2013-07-14 NOTE — Telephone Encounter (Signed)
rx called into pharmacy

## 2014-03-11 ENCOUNTER — Other Ambulatory Visit: Payer: Self-pay | Admitting: Internal Medicine

## 2014-04-20 ENCOUNTER — Encounter: Payer: Self-pay | Admitting: Internal Medicine

## 2014-04-20 ENCOUNTER — Ambulatory Visit (INDEPENDENT_AMBULATORY_CARE_PROVIDER_SITE_OTHER): Payer: Self-pay | Admitting: Internal Medicine

## 2014-04-20 VITALS — BP 148/104 | HR 87 | Temp 98.3°F | Wt 226.0 lb

## 2014-04-20 DIAGNOSIS — J069 Acute upper respiratory infection, unspecified: Secondary | ICD-10-CM

## 2014-04-20 MED ORDER — AZITHROMYCIN 250 MG PO TABS: ORAL_TABLET | ORAL | Status: DC

## 2014-04-20 MED ORDER — HYDROCODONE-HOMATROPINE 5-1.5 MG/5ML PO SYRP: 5.0000 mL | ORAL_SOLUTION | Freq: Three times a day (TID) | ORAL | Status: DC | PRN

## 2014-04-20 MED ORDER — ALBUTEROL SULFATE HFA 108 (90 BASE) MCG/ACT IN AERS: 2.0000 | INHALATION_SPRAY | Freq: Three times a day (TID) | RESPIRATORY_TRACT | Status: DC | PRN

## 2014-04-20 NOTE — Progress Notes (Signed)
Pre visit review using our clinic review tool, if applicable. No additional management support is needed unless otherwise documented below in the visit note. 

## 2014-04-20 NOTE — Progress Notes (Signed)
HPI  Pt presents to the clinic today with c/o cough and chest congestion. He  Reports this started 10 days ago. The cough is non productive. It seems to be worse at night. He has had some fever, chills and body aches. He denies shortness of breath. He has tried Dayquil without relief. He has not had sick contacts. He does have a history of allergies and takes zyrtec and flonase daily for that.  Review of Systems      Past Medical History  Diagnosis Date  . Pure hyperglyceridemia   . Unspecified sleep apnea   . GERD (gastroesophageal reflux disease)   . Dermatitis due to drugs and medicines taken internally(693.0)   . Allergy   . Anxiety     Family History  Problem Relation Age of Onset  . Cancer Neg Hx   . Diabetes Neg Hx   . Heart disease Neg Hx     History   Social History  . Marital Status: Married    Spouse Name: N/A    Number of Children: 2  . 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 Smoking status: Never Smoker   . Smokeless tobacco: Never Used  . Alcohol Use: 0.0 oz/week    0 Not specified per week     Comment: occasional  . Drug Use: No  . Sexual Activity: Not on file   Other Topics Concern  . Not on file   Social History Narrative    Allergies  Allergen Reactions  . Aspirin     REACTION: swelling     Constitutional: Positive headache, fatigue and fever. Denies abrupt weight changes.  HEENT:  Positive sore throat. Denies eye redness, eye pain, pressure behind the eyes, facial pain, nasal congestion, ear pain, ringing in the ears, wax buildup, runny nose or bloody nose. Respiratory: Positive cough. Denies difficulty breathing or shortness of breath.  Cardiovascular: Denies chest pain, chest tightness, palpitations or swelling in the hands or feet.   No other specific complaints in a complete review of systems (except as listed in HPI above).  Objective:   BP 148/104 mmHg   Pulse 87  Temp(Src) 98.3 F (36.8 C) (Oral)  Wt 226 lb (102.513 kg)  SpO2 98% Wt Readings from Last 3 Encounters:  04/20/14 226 lb (102.513 kg)  03/24/13 232 lb (105.235 kg)  01/07/12 233 lb (105.688 kg)     General: Appears his stated age, ill appearing in NAD. HEENT: Head: normal shape and size, no sinus tenderness noted; Eyes: sclera injected, no icterus, conjunctiva pink; Ears: Tm's gray and intact, normal light reflex; Nose: mucosa pink and moist, septum midline; Throat/Mouth: + PND. Teeth present, mucosa erythematous and moist, no exudate noted, no lesions or ulcerations noted.  Neck: No cervical lymphadenopathy.  Cardiovascular: Normal rate and rhythm. S1,S2 noted.  No murmur, rubs or gallops noted.  Pulmonary/Chest: Normal effort and positive vesicular breath sounds. No respiratory distress. No wheezes, rales or ronchi noted.      Assessment & Plan:   Upper Respiratory Infection:  Get some rest and drink plenty of water Do salt water gargles/Ibuprofen for the sore throat eRx for Azithromax x 5 days Rx for Hycodan cough syrup  RTC as needed or if symptoms persist.

## 2014-04-20 NOTE — Patient Instructions (Signed)
Upper Respiratory Infection, Adult An upper respiratory infection (URI) is also sometimes known as the common cold. The upper respiratory tract includes the nose, sinuses, throat, trachea, and bronchi. Bronchi are the airways leading to the lungs. Most people improve within 1 week, but symptoms can last up to 2 weeks. A residual cough may last even longer.  CAUSES Many different viruses can infect the tissues lining the upper respiratory tract. The tissues become irritated and inflamed and often become very moist. Mucus production is also common. A cold is contagious. You can easily spread the virus to others by oral contact. This includes kissing, sharing a glass, coughing, or sneezing. Touching your mouth or nose and then touching a surface, which is then touched by another person, can also spread the virus. SYMPTOMS  Symptoms typically develop 1 to 3 days after you come in contact with a cold virus. Symptoms vary from person to person. They may include:  Runny nose.  Sneezing.  Nasal congestion.  Sinus irritation.  Sore throat.  Loss of voice (laryngitis).  Cough.  Fatigue.  Muscle aches.  Loss of appetite.  Headache.  Low-grade fever. DIAGNOSIS  You might diagnose your own cold based on familiar symptoms, since most people get a cold 2 to 3 times a year. Your caregiver can confirm this based on your exam. Most importantly, your caregiver can check that your symptoms are not due to another disease such as strep throat, sinusitis, pneumonia, asthma, or epiglottitis. Blood tests, throat tests, and X-rays are not necessary to diagnose a common cold, but they may sometimes be helpful in excluding other more serious diseases. Your caregiver will decide if any further tests are required. RISKS AND COMPLICATIONS  You may be at risk for a more severe case of the common cold if you smoke cigarettes, have chronic heart disease (such as heart failure) or lung disease (such as asthma), or if  you have a weakened immune system. The very young and very old are also at risk for more serious infections. Bacterial sinusitis, middle ear infections, and bacterial pneumonia can complicate the common cold. The common cold can worsen asthma and chronic obstructive pulmonary disease (COPD). Sometimes, these complications can require emergency medical care and may be life-threatening. PREVENTION  The best way to protect against getting a cold is to practice good hygiene. Avoid oral or hand contact with people with cold symptoms. Wash your hands often if contact occurs. There is no clear evidence that vitamin C, vitamin E, echinacea, or exercise reduces the chance of developing a cold. However, it is always recommended to get plenty of rest and practice good nutrition. TREATMENT  Treatment is directed at relieving symptoms. There is no cure. Antibiotics are not effective, because the infection is caused by a virus, not by bacteria. Treatment may include:  Increased fluid intake. Sports drinks offer valuable electrolytes, sugars, and fluids.  Breathing heated mist or steam (vaporizer or shower).  Eating chicken soup or other clear broths, and maintaining good nutrition.  Getting plenty of rest.  Using gargles or lozenges for comfort.  Controlling fevers with ibuprofen or acetaminophen as directed by your caregiver.  Increasing usage of your inhaler if you have asthma. Zinc gel and zinc lozenges, taken in the first 24 hours of the common cold, can shorten the duration and lessen the severity of symptoms. Pain medicines may help with fever, muscle aches, and throat pain. A variety of non-prescription medicines are available to treat congestion and runny nose. Your caregiver   can make recommendations and may suggest nasal or lung inhalers for other symptoms.  HOME CARE INSTRUCTIONS   Only take over-the-counter or prescription medicines for pain, discomfort, or fever as directed by your  caregiver.  Use a warm mist humidifier or inhale steam from a shower to increase air moisture. This may keep secretions moist and make it easier to breathe.  Drink enough water and fluids to keep your urine clear or pale yellow.  Rest as needed.  Return to work when your temperature has returned to normal or as your caregiver advises. You may need to stay home longer to avoid infecting others. You can also use a face mask and careful hand washing to prevent spread of the virus. SEEK MEDICAL CARE IF:   After the first few days, you feel you are getting worse rather than better.  You need your caregiver's advice about medicines to control symptoms.  You develop chills, worsening shortness of breath, or brown or red sputum. These may be signs of pneumonia.  You develop yellow or brown nasal discharge or pain in the face, especially when you bend forward. These may be signs of sinusitis.  You develop a fever, swollen neck glands, pain with swallowing, or white areas in the back of your throat. These may be signs of strep throat. SEEK IMMEDIATE MEDICAL CARE IF:   You have a fever.  You develop severe or persistent headache, ear pain, sinus pain, or chest pain.  You develop wheezing, a prolonged cough, cough up blood, or have a change in your usual mucus (if you have chronic lung disease).  You develop sore muscles or a stiff neck. Document Released: 09/26/2000 Document Revised: 06/25/2011 Document Reviewed: 07/08/2013 Exit[employer] Patient Information 2015 [employer]. This information is not intended to replace advice given to you by your health care provider. Make sure you discuss any questions you have with your health care provider.  

## 2014-04-21 ENCOUNTER — Ambulatory Visit: Payer: Self-pay | Admitting: Internal Medicine

## 2014-05-27 ENCOUNTER — Ambulatory Visit (INDEPENDENT_AMBULATORY_CARE_PROVIDER_SITE_OTHER): Payer: Self-pay | Admitting: Internal Medicine

## 2014-05-27 ENCOUNTER — Encounter: Payer: Self-pay | Admitting: Internal Medicine

## 2014-05-27 VITALS — BP 122/88 | HR 69 | Temp 97.5°F | Ht 73.25 in | Wt 230.5 lb

## 2014-05-27 DIAGNOSIS — G4730 Sleep apnea, unspecified: Secondary | ICD-10-CM

## 2014-05-27 DIAGNOSIS — Z23 Encounter for immunization: Secondary | ICD-10-CM

## 2014-05-27 DIAGNOSIS — E781 Pure hyperglyceridemia: Secondary | ICD-10-CM

## 2014-05-27 DIAGNOSIS — K219 Gastro-esophageal reflux disease without esophagitis: Secondary | ICD-10-CM

## 2014-05-27 DIAGNOSIS — G4733 Obstructive sleep apnea (adult) (pediatric): Secondary | ICD-10-CM | POA: Insufficient documentation

## 2014-05-27 DIAGNOSIS — Z0000 Encounter for general adult medical examination without abnormal findings: Secondary | ICD-10-CM

## 2014-05-27 MED ORDER — TRAMADOL HCL 50 MG PO TABS: ORAL_TABLET | ORAL | Status: DC

## 2014-05-27 NOTE — Progress Notes (Signed)
Pre visit review using our clinic review tool, if applicable. No additional management support is needed unless otherwise documented below in the visit note. 

## 2014-05-27 NOTE — Assessment & Plan Note (Signed)
Healthy but still out of shape Counseling done about healthy eating and exercise

## 2014-05-27 NOTE — Assessment & Plan Note (Signed)
Will recheck but needs to work on lifestyle

## 2014-05-27 NOTE — Patient Instructions (Signed)
DASH Eating Plan °DASH stands for "Dietary Approaches to Stop Hypertension." The DASH eating plan is a healthy eating plan that has been shown to reduce high blood pressure (hypertension). Additional health benefits may include reducing the risk of type 2 diabetes mellitus, heart disease, and stroke. The DASH eating plan may also help with weight loss. °WHAT DO I NEED TO KNOW ABOUT THE DASH EATING PLAN? °For the DASH eating plan, you will follow these general guidelines: °· Choose foods with a percent daily value for sodium of less than 5% (as listed on the food label). °· Use salt-free seasonings or herbs instead of table salt or sea salt. °· Check with your health care provider or pharmacist before using salt substitutes. °· Eat lower-sodium products, often labeled as "lower sodium" or "no salt added." °· Eat fresh foods. °· Eat more vegetables, fruits, and low-fat dairy products. °· Choose whole grains. Look for the word "whole" as the first word in the ingredient list. °· Choose fish and skinless chicken or turkey more often than red meat. Limit fish, poultry, and meat to 6 oz (170 g) each day. °· Limit sweets, desserts, sugars, and sugary drinks. °· Choose heart-healthy fats. °· Limit cheese to 1 oz (28 g) per day. °· Eat more home-cooked food and less restaurant, buffet, and fast food. °· Limit fried foods. °· Cook foods using methods other than frying. °· Limit canned vegetables. If you do use them, rinse them well to decrease the sodium. °· When eating at a restaurant, ask that your food be prepared with less salt, or no salt if possible. °WHAT FOODS CAN I EAT? °Seek help from a dietitian for individual calorie needs. °Grains °Whole grain or whole wheat bread. Brown rice. Whole grain or whole wheat pasta. Quinoa, bulgur, and whole grain cereals. Low-sodium cereals. Corn or whole wheat flour tortillas. Whole grain cornbread. Whole grain crackers. Low-sodium crackers. °Vegetables °Fresh or frozen vegetables  (raw, steamed, roasted, or grilled). Low-sodium or reduced-sodium tomato and vegetable juices. Low-sodium or reduced-sodium tomato sauce and paste. Low-sodium or reduced-sodium canned vegetables.  °Fruits °All fresh, canned (in natural juice), or frozen fruits. °Meat and Other Protein Products °Ground beef (85% or leaner), grass-fed beef, or beef trimmed of fat. Skinless chicken or turkey. Ground chicken or turkey. Pork trimmed of fat. All fish and seafood. Eggs. Dried beans, peas, or lentils. Unsalted nuts and seeds. Unsalted canned beans. °Dairy °Low-fat dairy products, such as skim or 1% milk, 2% or reduced-fat cheeses, low-fat ricotta or cottage cheese, or plain low-fat yogurt. Low-sodium or reduced-sodium cheeses. °Fats and Oils °Tub margarines without trans fats. Light or reduced-fat mayonnaise and salad dressings (reduced sodium). Avocado. Safflower, olive, or canola oils. Natural peanut or almond butter. °Other °Unsalted popcorn and pretzels. °The items listed above may not be a complete list of recommended foods or beverages. Contact your dietitian for more options. °WHAT FOODS ARE NOT RECOMMENDED? °Grains °White bread. White pasta. White rice. Refined cornbread. Bagels and croissants. Crackers that contain trans fat. °Vegetables °Creamed or fried vegetables. Vegetables in a cheese sauce. Regular canned vegetables. Regular canned tomato sauce and paste. Regular tomato and vegetable juices. °Fruits °Dried fruits. Canned fruit in light or heavy syrup. Fruit juice. °Meat and Other Protein Products °Fatty cuts of meat. Ribs, chicken wings, bacon, sausage, bologna, salami, chitterlings, fatback, hot dogs, bratwurst, and packaged luncheon meats. Salted nuts and seeds. Canned beans with salt. °Dairy °Whole or 2% milk, cream, half-and-half, and cream cheese. Whole-fat or sweetened yogurt. Full-fat   cheeses or blue cheese. Nondairy creamers and whipped toppings. Processed cheese, cheese spreads, or cheese  curds. °Condiments °Onion and garlic salt, seasoned salt, table salt, and sea salt. Canned and packaged gravies. Worcestershire sauce. Tartar sauce. Barbecue sauce. Teriyaki sauce. Soy sauce, including reduced sodium. Steak sauce. Fish sauce. Oyster sauce. Cocktail sauce. Horseradish. Ketchup and mustard. Meat flavorings and tenderizers. Bouillon cubes. Hot sauce. Tabasco sauce. Marinades. Taco seasonings. Relishes. °Fats and Oils °Butter, stick margarine, lard, shortening, ghee, and bacon fat. Coconut, palm kernel, or palm oils. Regular salad dressings. °Other °Pickles and olives. Salted popcorn and pretzels. °The items listed above may not be a complete list of foods and beverages to avoid. Contact your dietitian for more information. °WHERE CAN I FIND MORE INFORMATION? °[employer], [employer]: www.nhlbi.nih.gov/health/health-topics/topics/dash/ °Document Released: 03/22/2011 Document Revised: 08/17/2013 Document Reviewed: 02/04/2013 °Exit[employer]® Patient Information ©2015 [employer]. This information is not intended to replace advice given to you by your health care provider. Make sure you discuss any questions you have with your health care provider. ° °

## 2014-05-27 NOTE — Assessment & Plan Note (Signed)
Doing okay with only intermittent PPI

## 2014-05-27 NOTE — Progress Notes (Signed)
Subjective:    Patient ID: Joseph Jackson, male    DOB: Jun 24, 1965, 49 y.o.   MRN: [id number]  HPI Here for physical  Somewhat less stress---wife doing some better Work is okay--some transitions but adjusting (3 new supervisors recently)  Had lost weight down to 220# over the spring and summer Had been monitoring steps--down a lot with the cold weather Discussed a more stable plan Cooking more at times--but lost their way somewhat (eating out more recently)  He relates a lot of the problems to not sleeping well Initiates fine but then will awaken Feels he stops breathing at times-- wife has noticed snoring and ?some apnea Also will have pain at night in back--will toss and turn Sparing NSAID and the tramadol (he gets irregular heart and increased BP with NSAIDs)  Satisfied with allergy control  Uses protonix ~ every 2-3 days Controls heartburn No swallowing problems  Current Outpatient Prescriptions on File Prior to Visit  Medication Sig Dispense Refill  . albuterol (PROVENTIL HFA;VENTOLIN HFA) 108 (90 BASE) MCG/ACT inhaler Inhale 2 puffs into the lungs 3 (three) times daily as needed. 1 Inhaler 0  . fexofenadine (ALLEGRA) 180 MG tablet Take 180 mg by mouth daily as needed for allergies or rhinitis.    . fluticasone (FLONASE) 50 MCG/ACT nasal spray Place 2 sprays into the nose daily.    . pantoprazole (PROTONIX) 40 MG tablet Take 1 tablet (40 mg total) by mouth daily as needed. 30 tablet 11  . traMADol (ULTRAM) 50 MG tablet TAKE ONE (1) TABLET 3 TIMES DAILY AS NEEDED FOR COUGH 60 tablet 0   No current facility-administered medications on file prior to visit.    Allergies  Allergen Reactions  . Aspirin     REACTION: swelling    Past Medical History  Diagnosis Date  . Pure hyperglyceridemia   . Unspecified sleep apnea   . GERD (gastroesophageal reflux disease)   . Dermatitis due to drugs and medicines taken internally(693.0)   . Allergy   . Anxiety     Past  Surgical History  Procedure Laterality Date  . US echocardiography  10/07    normal, holter benign     Family History  Problem Relation Age of Onset  . Diabetes Neg Hx   . Heart disease Neg Hx   . Cancer Mother   . Stroke Father   . Mental illness Sister     History   Social History  . Marital Status: Married    Spouse Name: N/A  . Number of Children: 2  . 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 Smoking status: Never Smoker   . Smokeless tobacco: Never Used  . Alcohol Use: 0.0 oz/week    0 Standard drinks or equivalent per week     Comment: occasional  . Drug Use: No  . Sexual Activity: Not on file   Other Topics Concern  . Not on file   Social History Narrative   Review of Systems  Constitutional: Negative for fatigue.       Wears seat belt  HENT: Positive for tinnitus. Negative for dental problem, hearing loss and trouble swallowing.        Regular with dentist  Eyes: Negative for visual disturbance.       No diplopia or unilateral vision loss  Respiratory: Positive for cough. Negative for chest tightness and shortness of breath.  Occ cough-- uses the albuterol rarely  Cardiovascular: Positive for palpitations. Negative for chest pain and leg swelling.  Gastrointestinal: Negative for nausea and vomiting.       Cramps at times Hemorrhoid--blood on paper at times  Endocrine: Negative for polydipsia and polyuria.  Genitourinary: Negative for urgency, frequency and difficulty urinating.       No sig ED  Musculoskeletal: Positive for back pain.       Uses inversion table for chronic back pain  Skin: Positive for rash.       Some scaling on arms and face-- uses steroid cream with success  Allergic/Immunologic: Positive for environmental allergies. Negative for immunocompromised state.  Neurological: Positive for numbness and headaches. Negative for dizziness, syncope, weakness and  light-headedness.       Rare migraine--excedrin migraine helps Still with numbness in feet --- 5-6 years  Hematological: Positive for adenopathy. Does not bruise/bleed easily.  Psychiatric/Behavioral: Positive for sleep disturbance. The patient is nervous/anxious.        Rare situational anxiety at this point occ feels down-- no persistence (1-2 days or rarely a week with anniversary of bad event)       Objective:   Physical Exam  Constitutional: He is oriented to person, place, and time. He appears well-developed and well-nourished. No distress.  HENT:  Head: Normocephalic and atraumatic.  Right Ear: External ear normal.  Left Ear: External ear normal.  Mouth/Throat: Oropharynx is clear and moist.  Eyes: Conjunctivae and EOM are normal. Pupils are equal, round, and reactive to light.  Neck: Normal range of motion. Neck supple. No thyromegaly present.  Cardiovascular: Normal rate, regular rhythm, normal heart sounds and intact distal pulses.  Exam reveals no gallop.   No murmur heard. Pulmonary/Chest: Effort normal and breath sounds normal. No respiratory distress. He has no wheezes. He has no rales.  Abdominal: Soft. There is no tenderness.  Musculoskeletal: He exhibits no edema or tenderness.  Lymphadenopathy:    He has no cervical adenopathy.  Neurological: He is alert and oriented to person, place, and time.  Skin:  Slight scaling spots  Psychiatric: He has a normal mood and affect. His behavior is normal.          Assessment & Plan:

## 2014-05-27 NOTE — Assessment & Plan Note (Signed)
Sounds like he has classic OSA Will send for referral to evaluate

## 2014-05-28 LAB — LDL CHOLESTEROL, DIRECT: Direct LDL: 117 mg/dL

## 2014-05-28 LAB — LIPID PANEL
Cholesterol: 195 mg/dL (ref 0–200)
HDL: 30.7 mg/dL — ABNORMAL LOW (ref >39.00)
NonHDL: 164.3
Total CHOL/HDL Ratio: 6
Triglycerides: 288 mg/dL — ABNORMAL HIGH (ref 0.0–149.0)
VLDL: 57.6 mg/dL — AB (ref 0.0–40.0)

## 2014-05-28 LAB — GLUCOSE, RANDOM: Glucose, Bld: 91 mg/dL (ref 70–99)

## 2014-05-29 ENCOUNTER — Encounter: Payer: Self-pay | Admitting: Internal Medicine

## 2014-05-31 ENCOUNTER — Telehealth: Payer: Self-pay | Admitting: *Deleted

## 2014-05-31 NOTE — Telephone Encounter (Signed)
Medications added to list as instructed.

## 2014-07-06 ENCOUNTER — Institutional Professional Consult (permissible substitution): Payer: Self-pay | Admitting: Pulmonary Disease

## 2014-08-17 ENCOUNTER — Institutional Professional Consult (permissible substitution): Payer: Self-pay | Admitting: Pulmonary Disease

## 2014-09-08 ENCOUNTER — Institutional Professional Consult (permissible substitution): Payer: BLUE CROSS/BLUE SHIELD | Admitting: Pulmonary Disease

## 2014-09-20 ENCOUNTER — Ambulatory Visit (INDEPENDENT_AMBULATORY_CARE_PROVIDER_SITE_OTHER): Payer: BLUE CROSS/BLUE SHIELD | Admitting: Pulmonary Disease

## 2014-09-20 ENCOUNTER — Encounter: Payer: Self-pay | Admitting: Internal Medicine

## 2014-09-20 VITALS — BP 134/86 | HR 79 | Temp 98.5°F | Ht 74.0 in | Wt 241.0 lb

## 2014-09-20 DIAGNOSIS — G4733 Obstructive sleep apnea (adult) (pediatric): Secondary | ICD-10-CM | POA: Diagnosis not present

## 2014-09-20 DIAGNOSIS — N200 Calculus of kidney: Secondary | ICD-10-CM | POA: Insufficient documentation

## 2014-09-20 NOTE — Progress Notes (Signed)
Subjective:    Patient ID: Joseph Jackson, male    DOB: Jan 19, 1966, 49 y.o.   MRN: [id number]  HPI The patient is a 49 year old male who I've been asked to see for possible obstructive sleep apnea. He tells me that he was diagnosed with sleep apnea in 2004, but his sleep study is not available for review. He was started on C Pap and wore for approximately one month, but discontinued this because of intolerance. He admits that he has a chronic pain syndrome, and that it interferes with his sleep. The patient has been noted to have very loud snoring, and his wife currently he is sleeping in a separate bed. The patient awakens with classic choking arousals, and does have frequent awakenings during the night. He is not rested in the mornings upon arising, and notes definite sleep pressure during the day with inactivity. He has some dozing in the evenings watching television, and can experience sleep pressure driving longer distances. The patient states that his weight is up 10 pounds over the last 2 years, and his Epworth score today is 11   Sleep Questionnaire What time do you typically go to bed?( Between what hours) 11p 11p at 1526 on 09/20/14 by Inge Rise, CMA How long does it take you to fall asleep? 10 min 10 min at 1526 on 09/20/14 by Inge Rise, CMA How many times during the night do you wake up? 5 5 at 1526 on 09/20/14 by Inge Rise, [place] What time do you get out of bed to start your day? 0700 0700 at 1526 on 09/20/14 by Inge Rise, CMA Do you drive or operate heavy machinery in your occupation? No No at 1526 on 09/20/14 by Inge Rise, CMA How much has your weight changed (up or down) over the past two years? (In pounds) 10 lb (4.536 kg) 10 lb (4.536 kg) at 1526 on 09/20/14 by Inge Rise, CMA Have you ever had a sleep study before? Yes Yes at 1526 on 09/20/14 by Inge Rise, CMA If yes, [facility] at 1526 on 09/20/14 by Inge Rise, CMA  If yes, date of study? 2004 2004 at 1526 on 09/20/14 by Inge Rise, CMA Do you currently use CPAP? No No at 1526 on 09/20/14 by Inge Rise, CMA Do you wear oxygen at any time? No No at 1526 on 09/20/14 by Inge Rise, CMA   Review of Systems  Constitutional: Negative for fever and unexpected weight change.  HENT: Positive for congestion and sneezing. Negative for dental problem, ear pain, nosebleeds, postnasal drip, rhinorrhea, sinus pressure, sore throat and trouble swallowing.   Eyes: Negative for redness and itching.  Respiratory: Negative for cough, chest tightness, shortness of breath and wheezing.   Cardiovascular: Negative for palpitations and leg swelling.  Gastrointestinal: Negative for nausea and vomiting.  Genitourinary: Negative for dysuria.  Musculoskeletal: Negative for joint swelling.  Skin: Negative for rash.  Neurological: Negative for headaches.  Hematological: Does not bruise/bleed easily.  Psychiatric/Behavioral: Negative for dysphoric mood. The patient is not nervous/anxious.        Objective:   Physical Exam Constitutional:  Obese male, no acute distress  HENT:  Nares patent without discharge  Oropharynx without exudate, palate and uvula are mildly elongated.   Eyes:  Perrla, eomi, no scleral icterus  Neck:  No JVD, no TMG  Cardiovascular:  Normal rate, regular rhythm, no rubs or gallops.  No murmurs  Intact distal pulses  Pulmonary :  Normal breath sounds, no stridor or respiratory distress   No rales, rhonchi, or wheezing  Abdominal:  Soft, nondistended, bowel sounds present.  No tenderness noted.   Musculoskeletal:  No lower extremity edema noted.  Lymph Nodes:  No cervical lymphadenopathy noted  Skin:  No cyanosis noted  Neurologic:  Alert, appropriate, moves all 4 extremities without obvious deficit.         Assessment & Plan:

## 2014-09-20 NOTE — Patient Instructions (Signed)
Will schedule your for home sleep testing, and arrange followup once the results are available.  Work on weight loss

## 2014-09-20 NOTE — Assessment & Plan Note (Signed)
The patient has a history of obstructive sleep apnea by his history, but his study is not available currently. His history is classic for significant sleep disordered breathing, and he will obviously need a follow-up study in order to verify the diagnosis and started on treatment. He is an excellent candidate for home sleep testing, and the patient is agreeable to this approach. I have had a long discussion with him about the pathophysiology of sleep apnea, including its impact to quality of life and cardiovascular health.

## 2014-10-01 ENCOUNTER — Other Ambulatory Visit: Payer: Self-pay | Admitting: Pulmonary Disease

## 2014-10-01 DIAGNOSIS — G4733 Obstructive sleep apnea (adult) (pediatric): Secondary | ICD-10-CM

## 2014-10-06 DIAGNOSIS — G4730 Sleep apnea, unspecified: Secondary | ICD-10-CM | POA: Diagnosis not present

## 2014-10-08 ENCOUNTER — Telehealth: Payer: Self-pay | Admitting: Pulmonary Disease

## 2014-10-08 DIAGNOSIS — G4730 Sleep apnea, unspecified: Secondary | ICD-10-CM | POA: Diagnosis not present

## 2014-10-08 NOTE — Telephone Encounter (Signed)
Mild 10/h F/U with TP

## 2014-10-08 NOTE — Telephone Encounter (Signed)
lmtcb

## 2014-10-11 ENCOUNTER — Other Ambulatory Visit: Payer: Self-pay | Admitting: *Deleted

## 2014-10-11 DIAGNOSIS — G4733 Obstructive sleep apnea (adult) (pediatric): Secondary | ICD-10-CM

## 2014-10-11 NOTE — Telephone Encounter (Signed)
Pt cb, [phone]

## 2014-10-11 NOTE — Telephone Encounter (Signed)
lmomtcb x 2  

## 2014-10-11 NOTE — Telephone Encounter (Signed)
Called and spoke with patient, aware of sleep study results. Patient would like to see TP prior to 11/05/2014  Keane Scrape - is there any openings for TP sooner than 7/22?    Please advise.

## 2014-10-12 NOTE — Telephone Encounter (Signed)
Spoke with pt and is aware nothing available sooner. appt scheduled for 7/22 at 3:30

## 2014-10-27 ENCOUNTER — Ambulatory Visit: Payer: Worker's Compensation

## 2014-10-27 ENCOUNTER — Ambulatory Visit (INDEPENDENT_AMBULATORY_CARE_PROVIDER_SITE_OTHER): Payer: Worker's Compensation | Admitting: Family Medicine

## 2014-10-27 VITALS — BP 138/86 | HR 69 | Temp 98.8°F | Resp 16 | Ht 74.0 in | Wt 232.4 lb

## 2014-10-27 DIAGNOSIS — M545 Low back pain, unspecified: Secondary | ICD-10-CM

## 2014-10-27 MED ORDER — IBUPROFEN 800 MG PO TABS: 800.0000 mg | ORAL_TABLET | Freq: Three times a day (TID) | ORAL | Status: DC | PRN

## 2014-10-27 MED ORDER — OXYCODONE-ACETAMINOPHEN 5-325 MG PO TABS: 1.0000 | ORAL_TABLET | Freq: Four times a day (QID) | ORAL | Status: DC | PRN

## 2014-10-27 MED ORDER — CYCLOBENZAPRINE HCL 10 MG PO TABS
10.0000 mg | ORAL_TABLET | Freq: Three times a day (TID) | ORAL | Status: DC | PRN
Start: 2014-10-27 — End: 2014-10-31

## 2014-10-27 NOTE — Progress Notes (Signed)
Subjective:  This chart was scribed for Delman Cheadle, MD by Thea Alken, ED Scribe. This patient was seen in room 13 and the patient's care was started at 5:29 PM.   Patient ID: Joseph Jackson, male    DOB: 07/02/1965, 49 y.o.   MRN: [id number]  HPI   Chief Complaint  Patient presents with  . Fall    Happened yesterday afternoon *Workers Comp*  . Back Pain   HPI Comments: Joseph Jackson is a 49 y.o. male who presents to the [facility] complaining of a worker's compensation fall that occurred yesterday afternoon. Pt states while working in a dusty room full of debris he slipped on a piece of paper and fell. He now has worsening, right, low back pain that began last night. Prior to bed last night he took multiple advil but woke up this morning with worsening pain. Pt reports pain with standing and walking long distanced as well as cramping to quads bilaterally. He takes 2 motrin 300 mg every 6 hours, which works best for him, and tramadol every night regularly but did not take medication prior to being seen. He has muscle relaxers at home but finds motrin works best. He reports multiple back injuries to low and mid back prior. He denies numbness and weakness and legs. Pt drove to be seen without difficulty.    Review of Systems  Gastrointestinal: Negative for nausea and vomiting.  Genitourinary: Negative for difficulty urinating.  Musculoskeletal: Positive for myalgias, back pain and gait problem (with long distance).  Neurological: Negative for weakness and numbness.   Objective:   Physical Exam  Constitutional: He is oriented to person, place, and time. He appears well-developed and well-nourished. No distress.  HENT:  Head: Normocephalic and atraumatic.  Eyes: Conjunctivae and EOM are normal.  Neck: Neck supple.  Cardiovascular: Normal rate.   Pulmonary/Chest: Effort normal.  Musculoskeletal: Normal range of motion.  No point tenderness over lumbar spinous.  Lumbar para spinal is normal. Swelling and muscle spasm over right sacrum and hip. No pain over greater trochanter. Forward bending: 45 degrees. No extension. Moderate reduction and trunk rotation.   Neurological: He is alert and oriented to person, place, and time.  Reflex Scores:      Patellar reflexes are 2+ on the right side and 2+ on the left side.      Achilles reflexes are 2+ on the right side and 2+ on the left side. Skin: Skin is warm and dry.  Psychiatric: He has a normal mood and affect. His behavior is normal.  Nursing note and vitals reviewed.  Filed Vitals:   10/27/14 1648  BP: 138/86  Pulse: 69  Temp: 98.8 F (37.1 C)  TempSrc: Oral  Resp: 16  Height: 6\' 2"  (1.88 m)  Weight: 232 lb 6.4 oz (105.416 kg)  SpO2: 98%   [facility] reading (PRIMARY) by Dr. Brigitte Pulse. Lumbar: normal Sacral: question apophysitis on right PSIS.  Assessment & Plan:   Pt has been advised to rest and ice lower back as need. He is to follow up with me in 4 days. 1. Right-sided low back pain without sciatica     Orders Placed This Encounter  Procedures  . DG Si Joints    Standing Status: Future     Number of Occurrences: 1     Standing Expiration Date: 10/27/2015    Order Specific Question:  Reason for Exam (SYMPTOM  OR DIAGNOSIS REQUIRED)    Answer:  pain  over upper right sacrum after fall    Order Specific Question:  Preferred imaging location?    Answer:  External  . DG Lumbar Spine Complete    Standing Status: Future     Number of Occurrences: 1     Standing Expiration Date: 10/27/2015    Order Specific Question:  Reason for Exam (SYMPTOM  OR DIAGNOSIS REQUIRED)    Answer:  pain over upper right sacrum after fall    Order Specific Question:  Preferred imaging location?    Answer:  External    Meds ordered this encounter  Medications  . DISCONTD: Ibuprofen (MOTRIN PO)    Sig: Take by mouth.  Marland Kitchen ibuprofen (ADVIL,MOTRIN) 800 MG tablet    Sig: Take 1 tablet (800 mg total) by mouth every 8  (eight) hours as needed for mild pain.    Dispense:  40 tablet    Refill:  0  . cyclobenzaprine (FLEXERIL) 10 MG tablet    Sig: Take 1 tablet (10 mg total) by mouth 3 (three) times daily as needed for muscle spasms.    Dispense:  30 tablet    Refill:  0  . oxyCODONE-acetaminophen (ROXICET) 5-325 MG per tablet    Sig: Take 1-2 tablets by mouth every 6 (six) hours as needed for severe pain.    Dispense:  30 tablet    Refill:  0    I personally performed the services described in this documentation, which was scribed in my presence. The recorded information has been reviewed and considered, and addended by me as needed.  Delman Cheadle, MD MPH

## 2014-10-31 ENCOUNTER — Ambulatory Visit (INDEPENDENT_AMBULATORY_CARE_PROVIDER_SITE_OTHER): Payer: Worker's Compensation | Admitting: Family Medicine

## 2014-10-31 VITALS — BP 142/90 | HR 76 | Temp 97.7°F | Resp 16 | Ht 74.0 in | Wt 231.4 lb

## 2014-10-31 DIAGNOSIS — S39012D Strain of muscle, fascia and tendon of lower back, subsequent encounter: Secondary | ICD-10-CM | POA: Diagnosis not present

## 2014-10-31 DIAGNOSIS — G5791 Unspecified mononeuropathy of right lower limb: Secondary | ICD-10-CM

## 2014-10-31 DIAGNOSIS — M5137 Other intervertebral disc degeneration, lumbosacral region: Secondary | ICD-10-CM | POA: Diagnosis not present

## 2014-10-31 LAB — POCT CBC
Granulocyte percent: 56.3 %G (ref 37–80)
HCT, POC: 46.9 % (ref 43.5–53.7)
HEMOGLOBIN: 16.3 g/dL (ref 14.1–18.1)
LYMPH, POC: 3.4 (ref 0.6–3.4)
MCH: 29.2 pg (ref 27–31.2)
MCHC: 34.7 g/dL (ref 31.8–35.4)
MCV: 84.1 fL (ref 80–97)
MID (cbc): 0.7 (ref 0–0.9)
MPV: 6.9 fL (ref 0–99.8)
POC GRANULOCYTE: 5.4 (ref 2–6.9)
POC LYMPH PERCENT: 35.9 %L (ref 10–50)
POC MID %: 7.8 %M (ref 0–12)
Platelet Count, POC: 256 10*3/uL (ref 142–424)
RBC: 5.58 M/uL (ref 4.69–6.13)
RDW, POC: 12.7 %
WBC: 9.6 10*3/uL (ref 4.6–10.2)

## 2014-10-31 LAB — GLUCOSE, POCT (MANUAL RESULT ENTRY): POC Glucose: 93 mg/dl (ref 70–99)

## 2014-10-31 LAB — POCT GLYCOSYLATED HEMOGLOBIN (HGB A1C): Hemoglobin A1C: 6.1

## 2014-10-31 MED ORDER — PREDNISONE 20 MG PO TABS: ORAL_TABLET | ORAL | Status: DC

## 2014-10-31 NOTE — Patient Instructions (Signed)
Start the prednisone today and the last dose will be on Wednesday - then can recheck with me on Wed night or Thursday morning.  Do not use the ibuprofen while on the prednisone.  Ok to cont using the flexeril and oxycodone - esp if needed for sleep at night.  Restart the ibuprofen as soon as the prednisone course is completed.  If no sig improvement at f/u then we will do MRI and specialty referral at that time.  Sciatica Sciatica is pain, weakness, numbness, or tingling along the path of the sciatic nerve. The nerve starts in the lower back and runs down the back of each leg. The nerve controls the muscles in the lower leg and in the back of the knee, while also providing sensation to the back of the thigh, lower leg, and the sole of your foot. Sciatica is a symptom of another medical condition. For instance, nerve damage or certain conditions, such as a herniated disk or bone spur on the spine, pinch or put pressure on the sciatic nerve. This causes the pain, weakness, or other sensations normally associated with sciatica. Generally, sciatica only affects one side of the body. CAUSES   Herniated or slipped disc.  Degenerative disk disease.  A pain disorder involving the narrow muscle in the buttocks (piriformis syndrome).  Pelvic injury or fracture.  Pregnancy.  Tumor (rare). SYMPTOMS  Symptoms can vary from mild to very severe. The symptoms usually travel from the low back to the buttocks and down the back of the leg. Symptoms can include:  Mild tingling or dull aches in the lower back, leg, or hip.  Numbness in the back of the calf or sole of the foot.  Burning sensations in the lower back, leg, or hip.  Sharp pains in the lower back, leg, or hip.  Leg weakness.  Severe back pain inhibiting movement. These symptoms may get worse with coughing, sneezing, laughing, or prolonged sitting or standing. Also, being overweight may worsen symptoms. DIAGNOSIS  Your caregiver will perform a  physical exam to look for common symptoms of sciatica. He or she may ask you to do certain movements or activities that would trigger sciatic nerve pain. Other tests may be performed to find the cause of the sciatica. These may include:  Blood tests.  X-rays.  Imaging tests, such as an MRI or CT scan. TREATMENT  Treatment is directed at the cause of the sciatic pain. Sometimes, treatment is not necessary and the pain and discomfort goes away on its own. If treatment is needed, your caregiver may suggest:  Over-the-counter medicines to relieve pain.  Prescription medicines, such as anti-inflammatory medicine, muscle relaxants, or narcotics.  Applying heat or ice to the painful area.  Steroid injections to lessen pain, irritation, and inflammation around the nerve.  Reducing activity during periods of pain.  Exercising and stretching to strengthen your abdomen and improve flexibility of your spine. Your caregiver may suggest losing weight if the extra weight makes the back pain worse.  Physical therapy.  Surgery to eliminate what is pressing or pinching the nerve, such as a bone spur or part of a herniated disk. HOME CARE INSTRUCTIONS   Only take over-the-counter or prescription medicines for pain or discomfort as directed by your caregiver.  Apply ice to the affected area for 20 minutes, 3-4 times a day for the first 48-72 hours. Then try heat in the same way.  Exercise, stretch, or perform your usual activities if these do not aggravate your  pain.  Attend physical therapy sessions as directed by your caregiver.  Keep all follow-up appointments as directed by your caregiver.  Do not wear high heels or shoes that do not provide proper support.  Check your mattress to see if it is too soft. A firm mattress may lessen your pain and discomfort. SEEK IMMEDIATE MEDICAL CARE IF:   You lose control of your bowel or bladder (incontinence).  You have increasing weakness in the lower  back, pelvis, buttocks, or legs.  You have redness or swelling of your back.  You have a burning sensation when you urinate.  You have pain that gets worse when you lie down or awakens you at night.  Your pain is worse than you have experienced in the past.  Your pain is lasting longer than 4 weeks.  You are suddenly losing weight without reason. MAKE SURE YOU:  Understand these instructions.  Will watch your condition.  Will get help right away if you are not doing well or get worse. Document Released: 03/27/2001 Document Revised: 10/02/2011 Document Reviewed: 08/12/2011 [facility] Patient Information 2015 [place], [place]. This information is not intended to replace advice given to you by your health care provider. Make sure you discuss any questions you have with your health care provider.

## 2014-10-31 NOTE — Progress Notes (Signed)
Subjective:  This chart was scribed for Delman Cheadle, MD by Thea Alken, ED Scribe. This patient was seen in room 10 and the patient's care was started at 11:51 AM.   Patient ID: Joseph Jackson, male    DOB: [birth year], 50 y.o.   MRN: [id number]  HPI   Chief Complaint  Patient presents with  . Follow-up    back pain, Workers Comp   HPI Comments: Joseph Jackson is a 49 y.o. male who presents to the [facility] for a worker's compensation follow up regarding right sided back pain secondary to a fall that occurred 4 days ago. Placed on high dose ibuprofen with as needed flexeril and oxycodone.   Since last visit pt states pain has changed. He has aching, right lower back pain with standing that radiates to right buttocks. He has been resting in his lazy boy chair and his bed and denies pain with rest.  He used flexeril and oxycodone the first night, just oxycodone the second night due to flexeril making him sleep, and just ibuprofen last night.  He denies changes in bladder or bowels, weakness and constipation. When last put on prednisone pt states he was hungry, grumpy and had difficulty sleeping.  He also c/o numbness and tingling to bilateral feet, right more than left.   Pt has not eaten today.   Review of Systems  Constitutional: Positive for activity change. Negative for fever, chills and unexpected weight change.  Gastrointestinal: Negative for diarrhea and constipation.  Genitourinary: Negative for enuresis and difficulty urinating.  Musculoskeletal: Positive for myalgias, back pain and gait problem.  Skin: Negative for rash.  Neurological: Positive for numbness ( bilateral feet). Negative for weakness.  Psychiatric/Behavioral: Negative for sleep disturbance.   Objective:   Physical Exam  Constitutional: He is oriented to person, place, and time. He appears well-developed and well-nourished. No distress.  HENT:  Head: Normocephalic and atraumatic.  Eyes:  Conjunctivae and EOM are normal.  Neck: Neck supple.  Cardiovascular: Normal rate.   Pulmonary/Chest: Effort normal.  Musculoskeletal: Normal range of motion.  No point tenderness of lumbar spinous process. No palpable para spinal spasms. Pain is located to right L5 para spinal.  Neurological: He is alert and oriented to person, place, and time.  Reflex Scores:      Patellar reflexes are 2+ on the right side and 2+ on the left side.      Achilles reflexes are 2+ on the right side and 2+ on the left side. 5/5 strength bilaterally.   Skin: Skin is warm and dry.  Psychiatric: He has a normal mood and affect. His behavior is normal.  Nursing note and vitals reviewed.   Filed Vitals:   10/31/14 1120  BP: 142/90  Pulse: 76  Temp: 97.7 F (36.5 C)  TempSrc: Oral  Resp: 16  Height: 6\' 2"  (1.88 m)  Weight: 231 lb 6.4 oz (104.962 kg)  SpO2: 99%   Assessment & Plan:   1. Lumbar strain, subsequent encounter   2. Disc disease, degenerative, lumbar or lumbosacral   3. Neuropathy of foot, right   Recheck in 4d. Still completely oow as cannot sit or stand without sig pain - only comfortable when laying down.  Cont ice - could try alt w/ heat - hold off on nsaids while on prednisone but cont flexeril qhs. Try gentle stretching, no lifting, no impact activity, try to change position several times every hr.  Orders Placed This Encounter  Procedures  . POCT CBC  . POCT glucose (manual entry)  . POCT glycosylated hemoglobin (Hb A1C)    Meds ordered this encounter  Medications  . predniSONE (DELTASONE) 20 MG tablet    Sig: 4 tabs po qd, 3 tabs po qd, 2 tabs po qd, 1 tab po qd, then restart ibuprofen    Dispense:  10 tablet    Refill:  1    I personally performed the services described in this documentation, which was scribed in my presence. The recorded information has been reviewed and considered, and addended by me as needed.  Delman Cheadle, MD MPH

## 2014-11-04 ENCOUNTER — Ambulatory Visit (INDEPENDENT_AMBULATORY_CARE_PROVIDER_SITE_OTHER): Payer: Worker's Compensation | Admitting: Family Medicine

## 2014-11-04 VITALS — BP 140/88 | HR 74 | Temp 98.6°F | Resp 18 | Ht 74.0 in | Wt 233.0 lb

## 2014-11-04 DIAGNOSIS — S338XXD Sprain of other parts of lumbar spine and pelvis, subsequent encounter: Secondary | ICD-10-CM

## 2014-11-04 DIAGNOSIS — S335XXD Sprain of ligaments of lumbar spine, subsequent encounter: Secondary | ICD-10-CM

## 2014-11-04 DIAGNOSIS — M62830 Muscle spasm of back: Secondary | ICD-10-CM

## 2014-11-04 DIAGNOSIS — S39012D Strain of muscle, fascia and tendon of lower back, subsequent encounter: Secondary | ICD-10-CM

## 2014-11-04 DIAGNOSIS — S339XXD Sprain of unspecified parts of lumbar spine and pelvis, subsequent encounter: Secondary | ICD-10-CM

## 2014-11-04 NOTE — Patient Instructions (Addendum)
Cont heat four times a day with gentle stretching.  Can ice inbetween.  Consider doing some slow pool walking or gentle swimming.  A recumbant bike to would be able to try just for 10 minutes.  Do not stand or sit longer than 20 minutes without position change. Try to lay down for 10 minutes at least once if not twice an hour every hour.  Make sure you are sleeping in proper back alignment with hips and knees both at 90 degrees and a pillow between your legs. Make sure you have excellent lumbar support with a tiny pillow or towel behind you low back.  Start very gentle stretching - such as cat/cow yoga poses. Continue taking the ibupforn.  Low Back Strain with Rehab A strain is an injury in which a tendon or muscle is torn. The muscles and tendons of the lower back are vulnerable to strains. However, these muscles and tendons are very strong and require a great force to be injured. Strains are classified into three categories. Grade 1 strains cause pain, but the tendon is not lengthened. Grade 2 strains include a lengthened ligament, due to the ligament being stretched or partially ruptured. With grade 2 strains there is still function, although the function may be decreased. Grade 3 strains involve a complete tear of the tendon or muscle, and function is usually impaired. SYMPTOMS   Pain in the lower back.  Pain that affects one side more than the other.  Pain that gets worse with movement and may be felt in the hip, buttocks, or back of the thigh.  Muscle spasms of the muscles in the back.  Swelling along the muscles of the back.  Loss of strength of the back muscles.  Crackling sound (crepitation) when the muscles are touched. CAUSES  Lower back strains occur when a force is placed on the muscles or tendons that is greater than they can handle. Common causes of injury include:  Prolonged overuse of the muscle-tendon units in the lower back, usually from incorrect posture.  A single  violent injury or force applied to the back. RISK INCREASES WITH:  Sports that involve twisting forces on the spine or a lot of bending at the waist (football, rugby, weightlifting, bowling, golf, tennis, speed skating, racquetball, swimming, running, gymnastics, diving).  Poor strength and flexibility.  Failure to warm up properly before activity.  Family history of lower back pain or disk disorders.  Previous back injury or surgery (especially fusion).  Poor posture with lifting, especially heavy objects.  Prolonged sitting, especially with poor posture. PREVENTION   Learn and use proper posture when sitting or lifting (maintain proper posture when sitting, lift using the knees and legs, not at the waist).  Warm up and stretch properly before activity.  Allow for adequate recovery between workouts.  Maintain physical fitness:  Strength, flexibility, and endurance.  Cardiovascular fitness. PROGNOSIS  If treated properly, lower back strains usually heal within 6 weeks. RELATED COMPLICATIONS   Recurring symptoms, resulting in a chronic problem.  Chronic inflammation, scarring, and partial muscle-tendon tear.  Delayed healing or resolution of symptoms.  Prolonged disability. TREATMENT  Treatment first involves the use of ice and medicine, to reduce pain and inflammation. The use of strengthening and stretching exercises may help reduce pain with activity. These exercises may be performed at home or with a therapist. Severe injuries may require referral to a therapist for further evaluation and treatment, such as ultrasound. Your caregiver may advise that you wear a  back brace or corset, to help reduce pain and discomfort. Often, prolonged bed rest results in greater harm then benefit. Corticosteroid injections may be recommended. However, these should be reserved for the most serious cases. It is important to avoid using your back when lifting objects. At night, sleep on your  back on a firm mattress with a pillow placed under your knees. If non-surgical treatment is unsuccessful, surgery may be needed.  MEDICATION   If pain medicine is needed, nonsteroidal anti-inflammatory medicines (aspirin and ibuprofen), or other minor pain relievers (acetaminophen), are often advised.  Do not take pain medicine for 7 days before surgery.  Prescription pain relievers may be given, if your caregiver thinks they are needed. Use only as directed and only as much as you need.  Ointments applied to the skin may be helpful.  Corticosteroid injections may be given by your caregiver. These injections should be reserved for the most serious cases, because they may only be given a certain number of times. HEAT AND COLD  Cold treatment (icing) should be applied for 10 to 15 minutes every 2 to 3 hours for inflammation and pain, and immediately after activity that aggravates your symptoms. Use ice packs or an ice massage.  Heat treatment may be used before performing stretching and strengthening activities prescribed by your caregiver, physical therapist, or athletic trainer. Use a heat pack or a warm water soak. SEEK MEDICAL CARE IF:   Symptoms get worse or do not improve in 2 to 4 weeks, despite treatment.  You develop numbness, weakness, or loss of bowel or bladder function.  New, unexplained symptoms develop. (Drugs used in treatment may produce side effects.) EXERCISES  RANGE OF MOTION (ROM) AND STRETCHING EXERCISES - Low Back Strain Most people with lower back pain will find that their symptoms get worse with excessive bending forward (flexion) or arching at the lower back (extension). The exercises which will help resolve your symptoms will focus on the opposite motion.  Your physician, physical therapist or athletic trainer will help you determine which exercises will be most helpful to resolve your lower back pain. Do not complete any exercises without first consulting with  your caregiver. Discontinue any exercises which make your symptoms worse until you speak to your caregiver.  If you have pain, numbness or tingling which travels down into your buttocks, leg or foot, the goal of the therapy is for these symptoms to move closer to your back and eventually resolve. Sometimes, these leg symptoms will get better, but your lower back pain may worsen. This is typically an indication of progress in your rehabilitation. Be very alert to any changes in your symptoms and the activities in which you participated in the 24 hours prior to the change. Sharing this information with your caregiver will allow him/her to most efficiently treat your condition.  These exercises may help you when beginning to rehabilitate your injury. Your symptoms may resolve with or without further involvement from your physician, physical therapist or athletic trainer. While completing these exercises, remember:  Restoring tissue flexibility helps normal motion to return to the joints. This allows healthier, less painful movement and activity.  An effective stretch should be held for at least 30 seconds.  A stretch should never be painful. You should only feel a gentle lengthening or release in the stretched tissue. FLEXION RANGE OF MOTION AND STRETCHING EXERCISES: STRETCH - Flexion, Single Knee to Chest   Lie on a firm bed or floor with both legs extended in  front of you.  Keeping one leg in contact with the floor, bring your opposite knee to your chest. Hold your leg in place by either grabbing behind your thigh or at your knee.  Pull until you feel a gentle stretch in your lower back. Hold __________ seconds.  Slowly release your grasp and repeat the exercise with the opposite side. Repeat __________ times. Complete this exercise __________ times per day.  STRETCH - Flexion, Double Knee to Chest   Lie on a firm bed or floor with both legs extended in front of you.  Keeping one leg in  contact with the floor, bring your opposite knee to your chest.  Tense your stomach muscles to support your back and then lift your other knee to your chest. Hold your legs in place by either grabbing behind your thighs or at your knees.  Pull both knees toward your chest until you feel a gentle stretch in your lower back. Hold __________ seconds.  Tense your stomach muscles and slowly return one leg at a time to the floor. Repeat __________ times. Complete this exercise __________ times per day.  STRETCH - Low Trunk Rotation  Lie on a firm bed or floor. Keeping your legs in front of you, bend your knees so they are both pointed toward the ceiling and your feet are flat on the floor.  Extend your arms out to the side. This will stabilize your upper body by keeping your shoulders in contact with the floor.  Gently and slowly drop both knees together to one side until you feel a gentle stretch in your lower back. Hold for __________ seconds.  Tense your stomach muscles to support your lower back as you bring your knees back to the starting position. Repeat the exercise to the other side. Repeat __________ times. Complete this exercise __________ times per day  EXTENSION RANGE OF MOTION AND FLEXIBILITY EXERCISES: STRETCH - Extension, Prone on Elbows   Lie on your stomach on the floor, a bed will be too soft. Place your palms about shoulder width apart and at the height of your head.  Place your elbows under your shoulders. If this is too painful, stack pillows under your chest.  Allow your body to relax so that your hips drop lower and make contact more completely with the floor.  Hold this position for __________ seconds.  Slowly return to lying flat on the floor. Repeat __________ times. Complete this exercise __________ times per day.  RANGE OF MOTION - Extension, Prone Press Ups  Lie on your stomach on the floor, a bed will be too soft. Place your palms about shoulder width apart  and at the height of your head.  Keeping your back as relaxed as possible, slowly straighten your elbows while keeping your hips on the floor. You may adjust the placement of your hands to maximize your comfort. As you gain motion, your hands will come more underneath your shoulders.  Hold this position __________ seconds.  Slowly return to lying flat on the floor. Repeat __________ times. Complete this exercise __________ times per day.  RANGE OF MOTION- Quadruped, Neutral Spine   Assume a hands and knees position on a firm surface. Keep your hands under your shoulders and your knees under your hips. You may place padding under your knees for comfort.  Drop your head and point your tail bone toward the ground below you. This will round out your lower back like an angry cat. Hold this position for __________  seconds.  Slowly lift your head and release your tail bone so that your back sags into a large arch, like an old horse.  Hold this position for __________ seconds.  Repeat this until you feel limber in your lower back.  Now, find your "sweet spot." This will be the most comfortable position somewhere between the two previous positions. This is your neutral spine. Once you have found this position, tense your stomach muscles to support your lower back.  Hold this position for __________ seconds. Repeat __________ times. Complete this exercise __________ times per day.  STRENGTHENING EXERCISES - Low Back Strain These exercises may help you when beginning to rehabilitate your injury. These exercises should be done near your "sweet spot." This is the neutral, low-back arch, somewhere between fully rounded and fully arched, that is your least painful position. When performed in this safe range of motion, these exercises can be used for people who have either a flexion or extension based injury. These exercises may resolve your symptoms with or without further involvement from your physician,  physical therapist or athletic trainer. While completing these exercises, remember:   Muscles can gain both the endurance and the strength needed for everyday activities through controlled exercises.  Complete these exercises as instructed by your physician, physical therapist or athletic trainer. Increase the resistance and repetitions only as guided.  You may experience muscle soreness or fatigue, but the pain or discomfort you are trying to eliminate should never worsen during these exercises. If this pain does worsen, stop and make certain you are following the directions exactly. If the pain is still present after adjustments, discontinue the exercise until you can discuss the trouble with your caregiver. STRENGTHENING - Deep Abdominals, Pelvic Tilt  Lie on a firm bed or floor. Keeping your legs in front of you, bend your knees so they are both pointed toward the ceiling and your feet are flat on the floor.  Tense your lower abdominal muscles to press your lower back into the floor. This motion will rotate your pelvis so that your tail bone is scooping upwards rather than pointing at your feet or into the floor.  With a gentle tension and even breathing, hold this position for __________ seconds. Repeat __________ times. Complete this exercise __________ times per day.  STRENGTHENING - Abdominals, Crunches   Lie on a firm bed or floor. Keeping your legs in front of you, bend your knees so they are both pointed toward the ceiling and your feet are flat on the floor. Cross your arms over your chest.  Slightly tip your chin down without bending your neck.  Tense your abdominals and slowly lift your trunk high enough to just clear your shoulder blades. Lifting higher can put excessive stress on the lower back and does not further strengthen your abdominal muscles.  Control your return to the starting position. Repeat __________ times. Complete this exercise __________ times per day.    STRENGTHENING - Quadruped, Opposite UE/LE Lift   Assume a hands and knees position on a firm surface. Keep your hands under your shoulders and your knees under your hips. You may place padding under your knees for comfort.  Find your neutral spine and gently tense your abdominal muscles so that you can maintain this position. Your shoulders and hips should form a rectangle that is parallel with the floor and is not twisted.  Keeping your trunk steady, lift your right hand no higher than your shoulder and then your left leg  no higher than your hip. Make sure you are not holding your breath. Hold this position __________ seconds.  Continuing to keep your abdominal muscles tense and your back steady, slowly return to your starting position. Repeat with the opposite arm and leg. Repeat __________ times. Complete this exercise __________ times per day.  STRENGTHENING - Lower Abdominals, Double Knee Lift  Lie on a firm bed or floor. Keeping your legs in front of you, bend your knees so they are both pointed toward the ceiling and your feet are flat on the floor.  Tense your abdominal muscles to brace your lower back and slowly lift both of your knees until they come over your hips. Be certain not to hold your breath.  Hold __________ seconds. Using your abdominal muscles, return to the starting position in a slow and controlled manner. Repeat __________ times. Complete this exercise __________ times per day.  POSTURE AND BODY MECHANICS CONSIDERATIONS - Low Back Strain Keeping correct posture when sitting, standing or completing your activities will reduce the stress put on different body tissues, allowing injured tissues a chance to heal and limiting painful experiences. The following are general guidelines for improved posture. Your physician or physical therapist will provide you with any instructions specific to your needs. While reading these guidelines, remember:  The exercises prescribed by  your provider will help you have the flexibility and strength to maintain correct postures.  The correct posture provides the best environment for your joints to work. All of your joints have less wear and tear when properly supported by a spine with good posture. This means you will experience a healthier, less painful body.  Correct posture must be practiced with all of your activities, especially prolonged sitting and standing. Correct posture is as important when doing repetitive low-stress activities (typing) as it is when doing a single heavy-load activity (lifting). RESTING POSITIONS Consider which positions are most painful for you when choosing a resting position. If you have pain with flexion-based activities (sitting, bending, stooping, squatting), choose a position that allows you to rest in a less flexed posture. You would want to avoid curling into a fetal position on your side. If your pain worsens with extension-based activities (prolonged standing, working overhead), avoid resting in an extended position such as sleeping on your stomach. Most people will find more comfort when they rest with their spine in a more neutral position, neither too rounded nor too arched. Lying on a non-sagging bed on your side with a pillow between your knees, or on your back with a pillow under your knees will often provide some relief. Keep in mind, being in any one position for a prolonged period of time, no matter how correct your posture, can still lead to stiffness. PROPER SITTING POSTURE In order to minimize stress and discomfort on your spine, you must sit with correct posture. Sitting with good posture should be effortless for a healthy body. Returning to good posture is a gradual process. Many people can work toward this most comfortably by using various supports until they have the flexibility and strength to maintain this posture on their own. When sitting with proper posture, your ears will fall  over your shoulders and your shoulders will fall over your hips. You should use the back of the chair to support your upper back. Your lower back will be in a neutral position, just slightly arched. You may place a small pillow or folded towel at the base of your lower back for support.  When working at a desk, create an environment that supports good, upright posture. Without extra support, muscles tire, which leads to excessive strain on joints and other tissues. Keep these recommendations in mind: CHAIR:  A chair should be able to slide under your desk when your back makes contact with the back of the chair. This allows you to work closely.  The chair's height should allow your eyes to be level with the upper part of your monitor and your hands to be slightly lower than your elbows. BODY POSITION  Your feet should make contact with the floor. If this is not possible, use a foot rest.  Keep your ears over your shoulders. This will reduce stress on your neck and lower back. INCORRECT SITTING POSTURES  If you are feeling tired and unable to assume a healthy sitting posture, do not slouch or slump. This puts excessive strain on your back tissues, causing more damage and pain. Healthier options include:  Using more support, like a lumbar pillow.  Switching tasks to something that requires you to be upright or walking.  Talking a brief walk.  Lying down to rest in a neutral-spine position. PROLONGED STANDING WHILE SLIGHTLY LEANING FORWARD  When completing a task that requires you to lean forward while standing in one place for a long time, place either foot up on a stationary 2-4 inch high object to help maintain the best posture. When both feet are on the ground, the lower back tends to lose its slight inward curve. If this curve flattens (or becomes too large), then the back and your other joints will experience too much stress, tire more quickly, and can cause pain. CORRECT STANDING  POSTURES Proper standing posture should be assumed with all daily activities, even if they only take a few moments, like when brushing your teeth. As in sitting, your ears should fall over your shoulders and your shoulders should fall over your hips. You should keep a slight tension in your abdominal muscles to brace your spine. Your tailbone should point down to the ground, not behind your body, resulting in an over-extended swayback posture.  INCORRECT STANDING POSTURES  Common incorrect standing postures include a forward head, locked knees and/or an excessive swayback. WALKING Walk with an upright posture. Your ears, shoulders and hips should all line-up. PROLONGED ACTIVITY IN A FLEXED POSITION When completing a task that requires you to bend forward at your waist or lean over a low surface, try to find a way to stabilize 3 out of 4 of your limbs. You can place a hand or elbow on your thigh or rest a knee on the surface you are reaching across. This will provide you more stability so that your muscles do not fatigue as quickly. By keeping your knees relaxed, or slightly bent, you will also reduce stress across your lower back. CORRECT LIFTING TECHNIQUES DO :   Assume a wide stance. This will provide you more stability and the opportunity to get as close as possible to the object which you are lifting.  Tense your abdominals to brace your spine. Bend at the knees and hips. Keeping your back locked in a neutral-spine position, lift using your leg muscles. Lift with your legs, keeping your back straight.  Test the weight of unknown objects before attempting to lift them.  Try to keep your elbows locked down at your sides in order get the best strength from your shoulders when carrying an object.  Always ask for help when lifting  heavy or awkward objects. INCORRECT LIFTING TECHNIQUES DO NOT:   Lock your knees when lifting, even if it is a small object.  Bend and twist. Pivot at your feet or  move your feet when needing to change directions.  Assume that you can safely pick up even a paper clip without proper posture. Document Released: 04/02/2005 Document Revised: 06/25/2011 Document Reviewed: 07/15/2008 [facility] Patient Information 2015 [place], [place]. This information is not intended to replace advice given to you by your health care provider. Make sure you discuss any questions you have with your health care provider.

## 2014-11-04 NOTE — Progress Notes (Signed)
Subjective:  .  Patient ID: Joseph Jackson, male    DOB: 02/11/1966, 49 y.o.   MRN: [id number] Chief Complaint  Patient presents with  . Follow-up  . Back Injury   HPI  HPI Comments: Joseph Jackson is a 49 y.o. male who presents to [facility] for a follow up for back pain.  Initially seen 8 days ago after he developed a right lumbar sacral back pain radiating down the right leg. Treated conservatively with rest, ice, pain medications and muscle relaxer. Out of work for four days with complete bed rest.   At follow up visit 4 days ago, pain had improved but it still radiating down right leg and he developed numbness in both feet. Checked A1C which was 6.1 and CBC which was normal, he was placed on a prednisone taper. He had been on prednisone prior and had a lot of several side effects. So he was placed on a short high dose taper which he completed today.  Today, he says if he stands too long his back began to ache and gradually escalates. As soon as he sits down pain improves. Pain shoots down the right buttock. He is able to stand for roughly 20-30 min, he can sit if he is supported, but without he cannot sit for long periods. He has not noticed weakness but still has numbness in his feet. He works for [employer] and [employer]. He has not needed to use any other medications. No urine or bowel incontinence.    Review of Systems  Constitutional: Positive for activity change. Negative for fever, chills, appetite change and unexpected weight change.  Cardiovascular: Negative for leg swelling.  Gastrointestinal: Negative for abdominal pain, diarrhea and constipation.  Genitourinary: Negative for urgency, frequency, decreased urine volume and difficulty urinating.  Musculoskeletal: Positive for myalgias, back pain, arthralgias and gait problem. Negative for joint swelling.  Skin: Negative for color change and wound.  Neurological: Positive for weakness. Negative  for dizziness, light-headedness and numbness.  Hematological: Does not bruise/bleed easily.  Psychiatric/Behavioral: Positive for sleep disturbance.      Objective:  BP 140/88 mmHg  Pulse 74  Temp(Src) 98.6 F (37 C) (Oral)  Resp 18  Ht '6\' 2"'  (1.88 m)  Wt 233 lb (105.688 kg)  BMI 29.90 kg/m2  SpO2 98%  Physical Exam  Constitutional: He is oriented to person, place, and time. He appears well-developed and well-nourished. No distress.  HENT:  Head: Normocephalic and atraumatic.  Eyes: Pupils are equal, round, and reactive to light.  Neck: Normal range of motion.  Cardiovascular: Normal rate and regular rhythm.   Pulmonary/Chest: Effort normal. No respiratory distress.  Musculoskeletal: Normal range of motion.  5/5 strength lower extremity bilaterally.  No point tenderness over the lumbar or SI joint. Moderate reduction of flexion can bend about 60 degrees. Moderate reduction of extension can bend about 10 degrees.  Neurological: He is alert and oriented to person, place, and time.  Reflex Scores:      Patellar reflexes are 2+ on the right side and 2+ on the left side.      Achilles reflexes are 2+ on the right side and 2+ on the left side. Skin: Skin is warm and dry.  Psychiatric: He has a normal mood and affect. His behavior is normal.  Nursing note and vitals reviewed.     Assessment & Plan:   1. Back spasm   2. Lumbar strain, subsequent encounter   3.  Lumbosacral ligament sprain, subsequent encounter   W/C f/u OV today from initial injury on 7/12 with initial OV on 7/13. This is his 3rd visit. Unfortunately, pt has substantially improved but I still do not think it is at all realistic to expect him to go back to work - I am concerned even light duty could exacerbate sxs.  He needs to be able to change position (sit to stand to lay) every 10 min and can't stand or sit for >2/3 of the day (so 20 min of every hours should be laying) due to severity and persistence of sxs but  I do suspect that as soon as he can get in with PT he will hopefully get sig improvement and be able to RTW light duty in 1 wk when rechecked.  Continue gentle strethcing and heat onto back. Start to very gradually increase exercise this week - try walking 1 block first and then gradually increase as tolerated.  Bennett Scrape PA-C met pt in office today - he will recheck with her in 1 wk  Orders Placed This Encounter  Procedures  . Ambulatory referral to Physical Therapy    Referral Priority:  Routine    Referral Type:  Physical Medicine    Referral Reason:  Specialty Services Required    Requested Specialty:  Physical Therapy    Number of Visits Requested:  1     I personally performed the services described in this documentation, which was scribed in my presence. The recorded information has been reviewed and considered, and addended by me as needed.  Delman Cheadle, MD MPH

## 2014-11-05 ENCOUNTER — Ambulatory Visit (INDEPENDENT_AMBULATORY_CARE_PROVIDER_SITE_OTHER): Payer: BLUE CROSS/BLUE SHIELD | Admitting: Adult Health

## 2014-11-05 ENCOUNTER — Encounter: Payer: Self-pay | Admitting: Adult Health

## 2014-11-05 VITALS — BP 148/98 | HR 77 | Temp 97.8°F | Ht 74.0 in | Wt 231.0 lb

## 2014-11-05 DIAGNOSIS — G4733 Obstructive sleep apnea (adult) (pediatric): Secondary | ICD-10-CM | POA: Diagnosis not present

## 2014-11-05 NOTE — Patient Instructions (Addendum)
Begin CPAP At bedtime   Set up for CPAP titration study at sleep lab  Work on weight loss.  Do not drive if sleepy.  follow up Dr. Elsworth Soho  In 2 months and As needed  With download

## 2014-11-05 NOTE — Assessment & Plan Note (Signed)
Mild OSA w/ AHI on HST   Plan  Begin CPAP At bedtime   Set up for CPAP titration study at sleep lab  Work on weight loss.  Do not drive if sleepy.  follow up Dr. Elsworth Soho  In 2 months and As needed

## 2014-11-05 NOTE — Progress Notes (Signed)
   Subjective:    Patient ID: Joseph Jackson, male    DOB: Apr 12, 1966, 49 y.o.   MRN: [id number]  HPI 49 yo male seen for sleep consult June 2016   11/05/2014 Follow up OSA Pt returns for follow up for sleep apnea Underwent HST on 10/06/14 that showed mild OSA  With AHI at 10.7/hr .  We discussed dx of osa and complications of untreated OSA He has nighttime snoring , daytime sleepiness.  We dicussed tx options including CPAP , wt loss, and oral appliance.  He would like to proceed with CPAP .  No chest pain , orthopnea , edema .     Review of Systems Constitutional:   No  weight loss, night sweats,  Fevers, chills, fatigue, or  lassitude.  HEENT:   No headaches,  Difficulty swallowing,  Tooth/dental problems, or  Sore throat,                No sneezing, itching, ear ache, nasal congestion, post nasal drip, Class 2 airway   CV:  No chest pain,  Orthopnea, PND, swelling in lower extremities, anasarca, dizziness, palpitations, syncope.   GI  No heartburn, indigestion, abdominal pain, nausea, vomiting, diarrhea, change in bowel habits, loss of appetite, bloody stools.   Resp: No shortness of breath with exertion or at rest.  No excess mucus, no productive cough,  No non-productive cough,  No coughing up of blood.  No change in color of mucus.  No wheezing.  No chest wall deformity  Skin: no rash or lesions.  GU: no dysuria, change in color of urine, no urgency or frequency.  No flank pain, no hematuria   MS:  No joint pain or swelling.  No decreased range of motion.  No back pain.  Psych:  No change in mood or affect. No depression or anxiety.  No memory loss.         Objective:   Physical Exam .exatmp        Assessment & Plan:

## 2014-11-05 NOTE — Addendum Note (Signed)
Addended by: Maurice March on: 11/05/2014 04:29 PM   Modules accepted: Orders

## 2014-11-09 NOTE — Progress Notes (Signed)
Reviewed & agree with plan  

## 2014-11-11 ENCOUNTER — Ambulatory Visit (INDEPENDENT_AMBULATORY_CARE_PROVIDER_SITE_OTHER): Payer: Worker's Compensation | Admitting: Physician Assistant

## 2014-11-11 VITALS — BP 120/74 | HR 77 | Temp 98.2°F | Resp 18 | Ht 74.5 in | Wt 233.0 lb

## 2014-11-11 DIAGNOSIS — S39012D Strain of muscle, fascia and tendon of lower back, subsequent encounter: Secondary | ICD-10-CM

## 2014-11-11 NOTE — Progress Notes (Signed)
[facility] [address] [place] [address] [phone]  Date:  11/11/2014   Name:  Joseph Jackson   DOB:  1966/02/14   MRN:  [id number]  PCP:  Viviana Simpler, MD    Chief Complaint: Follow-up and Back Injury   History of Present Illness:  This is a 49 y.o. male workers comp patient who is presenting for follow up back pain. Initial injury occurred on 7/12. He has been follow up with Dr. Brigitte Pulse. Last visit 1 week ago he had shown significant improvement in sciatic symptoms but he was needing to change positions every 20 minutes for symptom relief. He states now he can be in a position for 2 hours before sciatic symptoms return. Pain is become more localized and not radiating as much. Using ibuprofen only and helping. Has been using heated massage belt which also helps. He has not yet heard about physical therapy appts. He is ready to return to work but not ready to resume full duties. His job entails climbing ladders, heavy lifting/pulling/pushing.  Review of Systems:  Review of Systems See HPI  Allergies  Allergen Reactions  . Aspirin     REACTION: swelling    Medication list has been reviewed and updated.  Physical Examination:  Physical Exam  Constitutional: He is oriented to person, place, and time. He appears well-developed and well-nourished. No distress.  HENT:  Head: Normocephalic and atraumatic.  Right Ear: Hearing normal.  Left Ear: Hearing normal.  Nose: Nose normal.  Eyes: Conjunctivae and lids are normal. Right eye exhibits no discharge. Left eye exhibits no discharge. No scleral icterus.  Pulmonary/Chest: Effort normal. No respiratory distress.  Musculoskeletal: Normal range of motion.       Lumbar back: He exhibits no tenderness and no bony tenderness.  Straight leg raise negative Back not ttp  Neurological: He is alert and oriented to person, place, and time. He has normal strength and normal reflexes. No sensory deficit. Gait  normal.  Skin: Skin is warm, dry and intact. No lesion and no rash noted.  Psychiatric: He has a normal mood and affect. His speech is normal and behavior is normal. Thought content normal.   BP 120/74 mmHg  Pulse 77  Temp(Src) 98.2 F (36.8 C) (Oral)  Resp 18  Ht 6' 2.5" (1.892 m)  Wt 233 lb (105.688 kg)  BMI 29.52 kg/m2  SpO2 98%  Assessment and Plan:  1. Lumbar strain, subsequent encounter Symptoms are much improved but not resolved. He is able to stay in 1 position for 2 hours before symptoms return, which is improved from 20 minutes last week. Neuro exam normal. Will send message to referrals regarding PT. He will return to work on light duty. Continue ibuprofen and heat/massage belt. Return in 1 week for follow up.   Benjaman Pott Drenda Freeze, MHS [facility]  11/12/2014

## 2014-11-18 ENCOUNTER — Ambulatory Visit (INDEPENDENT_AMBULATORY_CARE_PROVIDER_SITE_OTHER): Payer: Worker's Compensation | Admitting: Physician Assistant

## 2014-11-18 VITALS — BP 138/90 | HR 68 | Temp 98.2°F | Resp 16 | Ht 75.0 in | Wt 234.6 lb

## 2014-11-18 DIAGNOSIS — M545 Low back pain, unspecified: Secondary | ICD-10-CM

## 2014-11-18 DIAGNOSIS — S39012D Strain of muscle, fascia and tendon of lower back, subsequent encounter: Secondary | ICD-10-CM

## 2014-11-18 NOTE — Progress Notes (Signed)
[facility] [address] [place] [address] [phone]  Date:  11/18/2014   Name:  Joseph Jackson   DOB:  Jul 27, 1965   MRN:  [id number]  PCP:  Viviana Simpler, MD    Chief Complaint: Follow-up   History of Present Illness:  This is a 49 y.o. Joseph Jackson is presenting for follow up back pain with right sided sciatica. No longer having sciatica unless driving for too long. Notices now more than pain he is experiencing muscle fatigue and weakness. He still has not heard about referral to PT. He is using heated massage belt and bought an inversion table - both are helping. He denies numbness, leg weakness, problems with bowel or bladder. He has not yet returned to work - he is waiting for clearance from workers comp. He would like to continue light duty at work but feels he can lift/push/pull more than he could one week ago.   Review of Systems:  Review of Systems See HPI  Allergies  Allergen Reactions  . Aspirin     REACTION: swelling   Medication list has been reviewed and updated.  Physical Examination:  Physical Exam  Constitutional: He is oriented to person, place, and time. He appears well-developed and well-nourished. No distress.  HENT:  Head: Normocephalic and atraumatic.  Right Ear: Hearing normal.  Left Ear: Hearing normal.  Nose: Nose normal.  Eyes: Conjunctivae and lids are normal. Right eye exhibits no discharge. Left eye exhibits no discharge. No scleral icterus.  Pulmonary/Chest: Effort normal. No respiratory distress.  Musculoskeletal: Normal range of motion.  Neurological: He is alert and oriented to person, place, and time. Gait normal.  Skin: Skin is warm, dry and intact. No lesion and no rash noted.  Psychiatric: He has a normal mood and affect. His speech is normal and behavior is normal. Thought content normal.   BP 138/90 mmHg  Pulse 68  Temp(Src) 98.2 F (36.8 C) (Oral)  Resp 16  Ht 6\' 3"  (1.905 m)  Wt 234 lb 9.6 oz  (106.414 kg)  BMI 29.32 kg/m2  SpO2 98%  Assessment and Plan:  1. Lumbar strain, subsequent encounter 2. Right-sided low back pain without sciatica Sent message to check on referral to PT. Gave pt a back manual with exercises he can try at home to strengthen muscles. Light duty at work with no lifting/pulling/pushing >20 pounds and no climbing ladders. He will return in 2 weeks for follow up - anticipate pt returning to full duty at that time.    Benjaman Pott Drenda Freeze, MHS [facility]  11/20/2014

## 2014-11-27 NOTE — Progress Notes (Signed)
  Medical screening examination/treatment/procedure(s) were performed by non-physician practitioner and as supervising physician I was immediately available for consultation/collaboration.     

## 2014-12-03 ENCOUNTER — Ambulatory Visit (INDEPENDENT_AMBULATORY_CARE_PROVIDER_SITE_OTHER): Payer: Worker's Compensation | Admitting: Family Medicine

## 2014-12-03 VITALS — BP 136/94 | HR 74 | Temp 98.1°F | Resp 16 | Ht 75.0 in | Wt 239.6 lb

## 2014-12-03 DIAGNOSIS — S39012D Strain of muscle, fascia and tendon of lower back, subsequent encounter: Secondary | ICD-10-CM

## 2014-12-03 NOTE — Patient Instructions (Signed)
Continue to attend PT and do your exercises Please see [place] in 3 weeks for a recheck  Work restrictions as below Light duty at work with no lifting/pulling/pushing >30 pounds and no climbing ladders

## 2014-12-03 NOTE — Progress Notes (Signed)
Joseph Jackson 1965/05/05 49 y.o.   Chief Complaint  Patient presents with  . Follow-up    workers comp     Date of Injury: 10/26/2014  History of Present Illness:  Presents for evaluation of work-related complaint.  He fell at work on 7/12- then developing right lower back pain.  Last seen here on 8/4- at that time his sciatics sx were gone but he idd have some muscle fatigue and weakness.  We were trying to get him into PT but appt was slow.  He had not yet RTW- was released to light duty He was finally able to get into PT- he has been a couple of times so far.   Sciatics remains resolved.   No bowel or bladder control concerns, no leg weakness or numbness.   His pain is "low, a 1 or 2 at best" at this time ROS    Allergies  Allergen Reactions  . Aspirin     REACTION: swelling     Current medications reviewed and updated. Past medical history, family history, social history have been reviewed and updated.   Physical Exam BP 136/94 mmHg  Pulse 74  Temp(Src) 98.1 F (36.7 C) (Oral)  Resp 16  Ht 6\' 3"  (1.905 m)  Wt 239 lb 9.6 oz (108.682 kg)  BMI 29.95 kg/m2  SpO2 98%  GEN: WDWN, NAD, Non-toxic, A & O x 3, large/ tall build HEENT: Atraumatic, Normocephalic. Neck supple. No masses, No LAD. Ears and Nose: No external deformity. CV: RRR, No M/G/R. No JVD. No thrill. No extra heart sounds. PULM: CTA B, no wheezes, crackles, rhonchi. No retractions. No resp. distress. No accessory muscle use. EXTR: No c/c/e NEURO Normal gait.  PSYCH: Normally interactive. Conversant. Not depressed or anxious appearing.  Calm demeanor.  Right lumbar paraspinous muscles with some tightness and mild spasm Normal lumbar ROM, normal BLE strength, sensation and DTR, negative SLR   Assessment and Plan: Lumbar strain, subsequent encounter  Improving WC injury.  Continue light duty but increased lifting from 20- 30 lbs Continue PT Recheck in 3 weeks

## 2014-12-28 ENCOUNTER — Ambulatory Visit (INDEPENDENT_AMBULATORY_CARE_PROVIDER_SITE_OTHER): Payer: Worker's Compensation | Admitting: Family Medicine

## 2014-12-28 VITALS — BP 164/94 | HR 65 | Temp 97.8°F | Resp 16 | Ht 74.5 in | Wt 240.0 lb

## 2014-12-28 DIAGNOSIS — S39012D Strain of muscle, fascia and tendon of lower back, subsequent encounter: Secondary | ICD-10-CM | POA: Diagnosis not present

## 2014-12-28 DIAGNOSIS — R030 Elevated blood-pressure reading, without diagnosis of hypertension: Secondary | ICD-10-CM

## 2014-12-28 NOTE — Progress Notes (Signed)
Back pain Subjective:  Patient ID: Joseph Jackson, male    DOB: 06/21/1965  Age: 49 y.o. MRN: [id number]  Patient is here for a follow-up on his back pain from his July 12 injury. He has gradually improved. Physical therapy and exercises helped. He is doing his regular job with some weight restrictions, but feels that he could do his job without restrictions at this point. He does some walking. Does not play any regular sports. He has a primary care doctor, and I discussed his blood pressure with him though unrelated to the worker's comp visit.   Objective:   Mild weight gain is noted. Good range of motion of his back.  Assessment & Plan:   Assessment:  Lumbar strain and pain, improved Blood pressure elevation   Plan:  Allow him to return to regular duty  Patient Instructions  Regular duty, but be conscious of the fact that it is easier to reinjuring back once it has had an injury.  Advise regular exercise and stretching as per the instructions of the physical therapists  Monitor your blood pressure, and if it remains greater than 140/90 see your primary care physician. Eat less salt, try to get regular exercise, and try to lose some weight.     HOPPER,DAVID, MD 12/28/2014

## 2014-12-28 NOTE — Patient Instructions (Signed)
Regular duty, but be conscious of the fact that it is easier to reinjuring back once it has had an injury.  Advise regular exercise and stretching as per the instructions of the physical therapists  Monitor your blood pressure, and if it remains greater than 140/90 see your primary care physician. Eat less salt, try to get regular exercise, and try to lose some weight.

## 2015-01-25 ENCOUNTER — Ambulatory Visit (HOSPITAL_BASED_OUTPATIENT_CLINIC_OR_DEPARTMENT_OTHER): Payer: BLUE CROSS/BLUE SHIELD | Attending: Adult Health

## 2015-01-25 DIAGNOSIS — I493 Ventricular premature depolarization: Secondary | ICD-10-CM | POA: Insufficient documentation

## 2015-01-25 DIAGNOSIS — G4731 Primary central sleep apnea: Secondary | ICD-10-CM | POA: Insufficient documentation

## 2015-01-25 DIAGNOSIS — G4730 Sleep apnea, unspecified: Secondary | ICD-10-CM | POA: Diagnosis present

## 2015-01-25 DIAGNOSIS — G4733 Obstructive sleep apnea (adult) (pediatric): Secondary | ICD-10-CM | POA: Insufficient documentation

## 2015-01-25 DIAGNOSIS — R0683 Snoring: Secondary | ICD-10-CM | POA: Insufficient documentation

## 2015-02-05 DIAGNOSIS — G4733 Obstructive sleep apnea (adult) (pediatric): Secondary | ICD-10-CM | POA: Diagnosis not present

## 2015-02-05 NOTE — Progress Notes (Signed)
   Patient Name: Joseph Jackson, Enriques Date: 01/25/2015 Gender: Male D.O.B: March 13, 1966 Age (years): 28 Referring Provider: Tammy Parrett Height (inches): 74 Interpreting Physician: Baird Lyons MD, ABSM Weight (lbs): 230 RPSGT: Madelon Lips BMI: 30 MRN: [id number] Neck Size: 17.50 CLINICAL INFORMATION The patient is referred for a CPAP titration to treat sleep apnea.   Date of NPSG, Split Night or HST:  Unattended home sleep test 10/06/14  AHI 10.7/ hr,  body weight241 lbs  SLEEP STUDY TECHNIQUE As per the AASM Manual for the Scoring of Sleep and Associated Events v2.3 (April 2016) with a hypopnea requiring 4% desaturations. The channels recorded and monitored were frontal, central and occipital EEG, electrooculogram (EOG), submentalis EMG (chin), nasal and oral airflow, thoracic and abdominal wall motion, anterior tibialis EMG, snore microphone, electrocardiogram, and pulse oximetry. Continuous positive airway pressure (CPAP) was initiated at the beginning of the study and titrated to treat sleep-disordered breathing. MEDICATIONS Medications taken by the patient : charted for review Medications administered by patient during sleep study : No sleep medicine administered.  TECHNICIAN COMMENTS Comments added by technician: NO RESTROOM BREAKS DURING STUDY.  Comments added by scorer: N/A  RESPIRATORY PARAMETERS Optimal PAP Pressure (cm):                       12 AHI at Optimal Pressure (/hr): 4.9 Overall Minimal O2 (%): 86.00 Supine % at Optimal Pressure (%): 95.3 Minimal O2 at Optimal Pressure (%): 86.00      SLEEP ARCHITECTURE The study was initiated at 11:09:43 PM and ended at 5:14:12 AM. Sleep onset time was 27.5 minutes and the sleep efficiency was 84.9%. The total sleep time was 309.5 minutes. The patient spent 4.68% of the night in stage N1 sleep, 69.14% in stage N2 sleep, 6.95% in stage N3 and 19.22% in REM.Stage REM latency was 58.0 minutes Wake after sleep onset was  27.5. Alpha intrusion was absent. Supine sleep was 41.84%.  CARDIAC DATA The 2 lead EKG demonstrated sinus rhythm. The mean heart rate was 58.23 beats per minute. Other EKG findings include: PVCs.  LEG MOVEMENT DATA The total Periodic Limb Movements of Sleep (PLMS) were 0. The PLMS index was 0.00. A PLMS index of <15 is considered normal in adults.  IMPRESSIONS - Mild Central Sleep Apnea was noted during this titration (CAI = 8.7/h). - Moderate oxygen desaturations were observed during this titration (min O2 = 86.00%). - The patient snored with Moderate snoring volume during this titration study. - 2-lead EKG demonstrated: PVCs - Clinically significant periodic limb movements were not noted during this study. Arousals associated with PLMs were rare.  DIAGNOSIS - Obstructive Sleep Apnea (327.23 [G47.33 ICD-10])  RECOMMENDATIONS - CPAP titration to 12 cwp, AHI 4.9/ hr. The patient wore a medium Resmed AirFit F10 full face mask with heated humidifier. - Avoid alcohol, sedatives and other CNS depressants that may worsen sleep apnea and disrupt normal sleep architecture. - Sleep hygiene should be reviewed to assess factors that may improve sleep quality. - Weight management and regular exercise should be initiated or continued.  Deneise Lever Diplomate, American Board of Sleep Medicine  ELECTRONICALLY SIGNED ON:  02/05/2015, 3:40 PM [place] PH: [phone]   FX: [phone] [place]

## 2015-03-09 ENCOUNTER — Encounter: Payer: Self-pay | Admitting: Internal Medicine

## 2015-03-09 NOTE — Telephone Encounter (Signed)
Can you please communicate with him about this?

## 2015-03-18 ENCOUNTER — Encounter: Payer: Self-pay | Admitting: Internal Medicine

## 2015-03-18 ENCOUNTER — Telehealth: Payer: Self-pay | Admitting: *Deleted

## 2015-03-18 DIAGNOSIS — G4733 Obstructive sleep apnea (adult) (pediatric): Secondary | ICD-10-CM

## 2015-03-18 NOTE — Telephone Encounter (Signed)
CPAP ordered. Patient notified. Patient will call back to schedule appointment once CPAP has been set up. Reminder set for [place] to follow. Nothing further needed. Closing encounter

## 2015-03-18 NOTE — Telephone Encounter (Signed)
-----   Message from Melvenia Needles, NP sent at 03/15/2015  8:19 PM EST ----- Need to get this pt started on CPAP 12 cmH20   CPAP titration RECOMMENDATIONS - CPAP titration to 12 cwp, AHI The patient wore a medium Resmed AirFit F10 full face mask with heated humidifier. Need ov in 1 month with download.   The CPAP titration never came to me that I ordered .   Tam

## 2015-05-10 ENCOUNTER — Telehealth: Payer: Self-pay | Admitting: Adult Health

## 2015-05-10 NOTE — Telephone Encounter (Signed)
Called and spoke with patient. Informed him that he will need a 6 week follow up for his CPAP compliance. Pt stated he received the CPAP on 04/14/15. I scheduled him for an ov with TP for 05/24/15 at 9:30 am. I explained to him to bring his CPAP chip in for a download. Pt voiced understanding and had no further questions. Nothing further needed.

## 2015-05-24 ENCOUNTER — Ambulatory Visit (INDEPENDENT_AMBULATORY_CARE_PROVIDER_SITE_OTHER): Payer: BLUE CROSS/BLUE SHIELD | Admitting: Adult Health

## 2015-05-24 ENCOUNTER — Encounter: Payer: Self-pay | Admitting: Adult Health

## 2015-05-24 VITALS — BP 134/88 | HR 55 | Temp 97.9°F | Ht 74.0 in | Wt 241.0 lb

## 2015-05-24 DIAGNOSIS — G4733 Obstructive sleep apnea (adult) (pediatric): Secondary | ICD-10-CM | POA: Diagnosis not present

## 2015-05-24 NOTE — Patient Instructions (Signed)
Continue on CPAP At bedtime .  Goal is at least 4-6 hr .  Work on weight loss.  Do not drive if sleepy.  follow up Dr. Elsworth Soho  In 6 months. And As needed

## 2015-05-24 NOTE — Assessment & Plan Note (Signed)
Doing well on CPAP   Plan  Continue on CPAP At bedtime .  Goal is at least 4-6 hr .  Work on weight loss.  Do not drive if sleepy.  follow up Dr. Elsworth Soho  In 6 months. And As needed

## 2015-05-24 NOTE — Progress Notes (Signed)
Subjective:    Patient ID: Joseph Jackson, male    DOB: 1966-03-24, 50 y.o.   MRN: [id number]  HPI 50 yo male seen for sleep consult June 2016   TEST  HST on 10/06/14 that showed mild OSA With AHI at 10.7/hr  CPAP titration study 01/2015>CPAP titration to 12 cwp, AHI The patient wore a medium Resmed AirFit F10 full face mask with heated humidifier.   05/24/2015 Follow up OSA Pt returns for follow up for sleep apnea Underwent HST on 10/06/14 that showed mild OSA  With AHI at 10.7/hr . He opted to go with CPAP treatment  He was set up for titration study .  This was done in Oct . CPAP titration to 12 cwp, AHI The patient wore a medium Resmed AirFit F10 full face mask with heated humidifier. He started on CPAP end of December.  Says he is getting used to CPAP . Does feel it is helping him.  Using MyAir App which he feels is very helpful.  Has tried several different mask and chin straps .  Now using nasal cannula mask that is more comfortable.  Download shows good compliance with 5.44 hr usage . AHI 3.3.  +leaks . Doee not feel they interfere with his sleep.    No chest pain , orthopnea , edema .     Review of Systems Constitutional:   No  weight loss, night sweats,  Fevers, chills, fatigue, or  lassitude.  HEENT:   No headaches,  Difficulty swallowing,  Tooth/dental problems, or  Sore throat,                No sneezing, itching, ear ache, nasal congestion, post nasal drip, Class 2 airway   CV:  No chest pain,  Orthopnea, PND, swelling in lower extremities, anasarca, dizziness, palpitations, syncope.   GI  No heartburn, indigestion, abdominal pain, nausea, vomiting, diarrhea, change in bowel habits, loss of appetite, bloody stools.   Resp: No shortness of breath with exertion or at rest.  No excess mucus, no productive cough,  No non-productive cough,  No coughing up of blood.  No change in color of mucus.  No wheezing.  No chest wall deformity  Skin: no rash or lesions.  GU:  no dysuria, change in color of urine, no urgency or frequency.  No flank pain, no hematuria   MS:  No joint pain or swelling.  No decreased range of motion.  No back pain.  Psych:  No change in mood or affect. No depression or anxiety.  No memory loss.         Objective:   Physical Exam  Filed Vitals:   05/24/15 0911  BP: 134/88  Pulse: 55  Temp: 97.9 F (36.6 C)  TempSrc: Oral  Height: 6\' 2"  (1.88 m)  Weight: 241 lb (109.317 kg)  SpO2: 98%   Body mass index is 30.93 kg/(m^2).   GEN: A/Ox3; pleasant , NAD, obese   HEENT:  [place]/AT,  EACs-clear, TMs-wnl, NOSE-clear, THROAT-clear, no lesions, no postnasal drip or exudate noted. Class 2-3 MP airway   NECK:  Supple w/ fair ROM; no JVD; normal carotid impulses w/o bruits; no thyromegaly or nodules palpated; no lymphadenopathy.  RESP  Clear  P & A; w/o, wheezes/ rales/ or rhonchi.no accessory muscle use, no dullness to percussion  CARD:  RRR, no m/r/g  , no peripheral edema, pulses intact, no cyanosis or clubbing.  GI:   Soft & nt; nml bowel sounds; no organomegaly  or masses detected.  Musco: Warm bil, no deformities or joint swelling noted.   Neuro: alert, no focal deficits noted.    Skin: Warm, no lesions or rashes         Assessment & Plan:

## 2015-06-01 ENCOUNTER — Ambulatory Visit (INDEPENDENT_AMBULATORY_CARE_PROVIDER_SITE_OTHER): Payer: BLUE CROSS/BLUE SHIELD | Admitting: Internal Medicine

## 2015-06-01 ENCOUNTER — Telehealth: Payer: Self-pay | Admitting: Internal Medicine

## 2015-06-01 ENCOUNTER — Encounter: Payer: Self-pay | Admitting: Internal Medicine

## 2015-06-01 VITALS — BP 138/88 | HR 63 | Temp 97.4°F | Ht 74.0 in | Wt 238.0 lb

## 2015-06-01 DIAGNOSIS — Z0000 Encounter for general adult medical examination without abnormal findings: Secondary | ICD-10-CM | POA: Diagnosis not present

## 2015-06-01 DIAGNOSIS — R7989 Other specified abnormal findings of blood chemistry: Secondary | ICD-10-CM

## 2015-06-01 DIAGNOSIS — Z23 Encounter for immunization: Secondary | ICD-10-CM | POA: Diagnosis not present

## 2015-06-01 DIAGNOSIS — R030 Elevated blood-pressure reading, without diagnosis of hypertension: Secondary | ICD-10-CM | POA: Diagnosis not present

## 2015-06-01 DIAGNOSIS — J301 Allergic rhinitis due to pollen: Secondary | ICD-10-CM

## 2015-06-01 LAB — CBC WITH DIFFERENTIAL/PLATELET
BASOS PCT: 1 % (ref 0.0–3.0)
Basophils Absolute: 0.1 10*3/uL (ref 0.0–0.1)
EOS PCT: 3.8 % (ref 0.0–5.0)
Eosinophils Absolute: 0.3 10*3/uL (ref 0.0–0.7)
HEMATOCRIT: 41.4 % (ref 39.0–52.0)
HEMOGLOBIN: 14.2 g/dL (ref 13.0–17.0)
LYMPHS PCT: 29.9 % (ref 12.0–46.0)
Lymphs Abs: 2.5 10*3/uL (ref 0.7–4.0)
MCHC: 34.2 g/dL (ref 30.0–36.0)
MCV: 85.6 fl (ref 78.0–100.0)
Monocytes Absolute: 0.5 10*3/uL (ref 0.1–1.0)
Monocytes Relative: 5.8 % (ref 3.0–12.0)
NEUTROS ABS: 4.9 10*3/uL (ref 1.4–7.7)
Neutrophils Relative %: 59.5 % (ref 43.0–77.0)
Platelets: 207 10*3/uL (ref 150.0–400.0)
RBC: 4.84 Mil/uL (ref 4.22–5.81)
RDW: 13.3 % (ref 11.5–15.5)
WBC: 8.2 10*3/uL (ref 4.0–10.5)

## 2015-06-01 LAB — LIPID PANEL
CHOL/HDL RATIO: 6
Cholesterol: 184 mg/dL (ref 0–200)
HDL: 29.4 mg/dL — AB (ref >39.00)
NONHDL: 154.58
Triglycerides: 306 mg/dL — ABNORMAL HIGH (ref 0.0–149.0)
VLDL: 61.2 mg/dL — AB (ref 0.0–40.0)

## 2015-06-01 LAB — COMPREHENSIVE METABOLIC PANEL
ALBUMIN: 4.8 g/dL (ref 3.5–5.2)
ALT: 21 U/L (ref 0–53)
AST: 15 U/L (ref 0–37)
Alkaline Phosphatase: 52 U/L (ref 39–117)
BUN: 17 mg/dL (ref 6–23)
CALCIUM: 9.7 mg/dL (ref 8.4–10.5)
CHLORIDE: 106 meq/L (ref 96–112)
CO2: 27 meq/L (ref 19–32)
Creatinine, Ser: 0.77 mg/dL (ref 0.40–1.50)
GFR: 113.28 mL/min (ref >60.00)
Glucose, Bld: 119 mg/dL — ABNORMAL HIGH (ref 70–99)
POTASSIUM: 4.2 meq/L (ref 3.5–5.1)
SODIUM: 140 meq/L (ref 135–145)
Total Bilirubin: 0.5 mg/dL (ref 0.2–1.2)
Total Protein: 7.1 g/dL (ref 6.0–8.3)

## 2015-06-01 LAB — PSA: PSA: 0.66 ng/mL (ref 0.10–4.00)

## 2015-06-01 LAB — T4, FREE: Free T4: 0.92 ng/dL (ref 0.60–1.60)

## 2015-06-01 LAB — LDL CHOLESTEROL, DIRECT: LDL DIRECT: 79 mg/dL

## 2015-06-01 MED ORDER — CLOBETASOL PROPIONATE 0.05 % EX OINT: 1.0000 "application " | TOPICAL_OINTMENT | CUTANEOUS | Status: DC | PRN

## 2015-06-01 MED ORDER — CALCIPOTRIENE 0.005 % EX CREA: TOPICAL_CREAM | CUTANEOUS | Status: DC | PRN

## 2015-06-01 NOTE — Patient Instructions (Signed)
Please try doing core strengthening to help your back.

## 2015-06-01 NOTE — Addendum Note (Signed)
Addended by: Despina Hidden on: 06/01/2015 12:06 PM   Modules accepted: Orders

## 2015-06-01 NOTE — Assessment & Plan Note (Signed)
If needs albuterol more--will consider montelukast

## 2015-06-01 NOTE — Assessment & Plan Note (Signed)
Will check PSA after discussion Fecal immunoassay -he will check about colonoscopy coverage

## 2015-06-01 NOTE — Assessment & Plan Note (Signed)
BP Readings from Last 3 Encounters:  06/01/15 138/88  05/24/15 134/88  12/28/14 164/94   If he gets persistently elevated numbers at home with headache--will start Rx

## 2015-06-01 NOTE — Progress Notes (Signed)
Subjective:    Patient ID: Joseph Jackson, male    DOB: 1966/03/14, 50 y.o.   MRN: [id number]  HPI Here for physical  Still having a lot of back pain Using aleve and ibuprofen prn---mostly at bedtime Tries to do some stretching Discussed core work  Just got CPAP machine Still getting used to it and still tired in day at times  Mood generally okay He feels fairly level Wife still has bad days among the good--- still worried about her (pain, fibromyalgia, etc)  Continues on allergy meds Rarely uses the albuterol-- mostly in spring  Has noted his BP going up Watching it at home As high as 156/90--and he doesn't feel good then (headaches, etc)  Current Outpatient Prescriptions on File Prior to Visit  Medication Sig Dispense Refill  . albuterol (PROVENTIL HFA;VENTOLIN HFA) 108 (90 BASE) MCG/ACT inhaler Inhale 2 puffs into the lungs 3 (three) times daily as needed. 1 Inhaler 0  . calcipotriene (DOVONOX) 0.005 % cream Apply topically as needed.    . clobetasol ointment (TEMOVATE) [id number] % Apply 1 application topically as needed.    . cyclobenzaprine (FLEXERIL) 10 MG tablet Take 1 tablet by mouth as needed.    . fexofenadine (ALLEGRA) 180 MG tablet Take 180 mg by mouth daily as needed for allergies or rhinitis.    . fluticasone (FLONASE) 50 MCG/ACT nasal spray Place 2 sprays into the nose daily.    Marland Kitchen ibuprofen (ADVIL,MOTRIN) 800 MG tablet Take 1 tablet by mouth as needed.     No current facility-administered medications on file prior to visit.    Allergies  Allergen Reactions  . Aspirin     REACTION: swelling    Past Medical History  Diagnosis Date  . Pure hyperglyceridemia   . Unspecified sleep apnea   . GERD (gastroesophageal reflux disease)   . Dermatitis due to drugs and medicines taken internally(693.0)   . Allergy   . Anxiety   . Nephrolithiasis 6/16    Passed kidney stone    Past Surgical History  Procedure Laterality Date  . US echocardiography  10/07   normal, holter benign   . Wisdom tooth extraction      Family History  Problem Relation Age of Onset  . Diabetes Neg Hx   . Cancer Mother   . Stroke Father   . Heart disease Father     MI due to aortic tear  . Mental illness Sister     Social History   Social History  . Marital Status: Married    Spouse Name: N/A  . Number of Children: 2  . 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 Smoking status: Never Smoker   . Smokeless tobacco: Never Used  . Alcohol Use: No     Comment: occasional  . Drug Use: No  . Sexual Activity: Not on file   Other Topics Concern  . Not on file   Social History Narrative   Review of Systems  Constitutional: Positive for fatigue.       Wears seat belt  HENT: Positive for tinnitus. Negative for dental problem and hearing loss.        Keeps up with dentist  Eyes: Negative for visual disturbance.       No diplopia or unilateral vision loss  Respiratory: Positive for wheezing. Negative for cough, chest tightness and shortness of breath.  Gets itchy and burning in chest with borderline asthma--- hasn't needed albuterol in a long time though  Cardiovascular: Negative for chest pain and leg swelling.       Palpitations if he takes too many NSAIDs  Gastrointestinal: Negative for nausea, vomiting and abdominal pain.       Lactose sensitive---cramping and diarrhea Occ heartburn--pepcid complete helps  Endocrine: Negative for polydipsia and polyuria.  Genitourinary: Negative for urgency and difficulty urinating.       Less stamina--- but not ED  Musculoskeletal: Positive for back pain. Negative for joint swelling and arthralgias.  Skin: Positive for rash.       Nasal and upper lip rash--dovonex helps  Allergic/Immunologic: Positive for environmental allergies. Negative for immunocompromised state.  Neurological: Positive for headaches. Negative for dizziness, syncope,  weakness and light-headedness.  Hematological: Positive for adenopathy. Does not bruise/bleed easily.       Notes neck glands with allergy flares  Psychiatric/Behavioral: Positive for sleep disturbance and dysphoric mood. The patient is nervous/anxious.        Situational mood issues still        Objective:   Physical Exam  Constitutional: He is oriented to person, place, and time. He appears well-developed and well-nourished. No distress.  HENT:  Head: Normocephalic and atraumatic.  Right Ear: External ear normal.  Left Ear: External ear normal.  Mouth/Throat: Oropharynx is clear and moist. No oropharyngeal exudate.  Eyes: Conjunctivae and EOM are normal. Pupils are equal, round, and reactive to light.  Neck: Normal range of motion. Neck supple. No thyromegaly present.  Cardiovascular: Normal rate, regular rhythm, normal heart sounds and intact distal pulses.  Exam reveals no gallop.   No murmur heard. Pulmonary/Chest: Effort normal and breath sounds normal. No respiratory distress. He has no wheezes. He has no rales.  Abdominal: Soft. There is no tenderness.  Musculoskeletal: He exhibits no edema or tenderness.  Lymphadenopathy:    He has no cervical adenopathy.  Neurological: He is alert and oriented to person, place, and time.  Skin: No rash noted. No erythema.  Psychiatric: He has a normal mood and affect. His behavior is normal.          Assessment & Plan:

## 2015-06-01 NOTE — Telephone Encounter (Signed)
Pt forgot to tell dr Silvio Pate he needs refill clobetasol ointment,calcipotriene ointment [facility]

## 2015-06-01 NOTE — Telephone Encounter (Signed)
His was sent in at his OV

## 2015-06-01 NOTE — Telephone Encounter (Signed)
Pt aware.

## 2015-06-01 NOTE — Progress Notes (Signed)
Pre visit review using our clinic review tool, if applicable. No additional management support is needed unless otherwise documented below in the visit note. 

## 2015-06-03 ENCOUNTER — Encounter: Payer: Self-pay | Admitting: Adult Health

## 2015-06-30 NOTE — Progress Notes (Signed)
Reviewed & agree with plan  

## 2015-09-12 ENCOUNTER — Other Ambulatory Visit: Payer: Self-pay | Admitting: Family Medicine

## 2015-09-23 ENCOUNTER — Encounter: Payer: Self-pay | Admitting: Internal Medicine

## 2015-09-23 MED ORDER — TRAMADOL HCL 50 MG PO TABS: ORAL_TABLET | ORAL | Status: DC

## 2015-09-23 MED ORDER — IBUPROFEN 800 MG PO TABS: 800.0000 mg | ORAL_TABLET | Freq: Four times a day (QID) | ORAL | Status: DC | PRN

## 2015-09-23 NOTE — Telephone Encounter (Signed)
Please call him We haven't given him the tramadol for a year If he still needs it occasionally--okay #30 x 0 Ibuprofen 800mg  #100 x 5

## 2015-09-23 NOTE — Telephone Encounter (Signed)
I had already spoken to the pt to verify he needed it since it had been so long and we had not received the request from the pharmacy. I have filled the meds on voice mail at the pharmacy

## 2015-09-23 NOTE — Telephone Encounter (Signed)
I have not received a refill request for [facility] as of yet. I called the patient to verify the tramadol request. It was in his medication history but his current med list.  Tramadol last filled 05-27-14 #60. Last OV 06-01-15 CPE No Future OV  Ibuprofen 800mg  #40 10-27-14

## 2016-01-02 ENCOUNTER — Ambulatory Visit (INDEPENDENT_AMBULATORY_CARE_PROVIDER_SITE_OTHER): Payer: BLUE CROSS/BLUE SHIELD | Admitting: Pulmonary Disease

## 2016-01-02 ENCOUNTER — Encounter: Payer: Self-pay | Admitting: Pulmonary Disease

## 2016-01-02 DIAGNOSIS — G4733 Obstructive sleep apnea (adult) (pediatric): Secondary | ICD-10-CM | POA: Diagnosis not present

## 2016-01-02 DIAGNOSIS — J301 Allergic rhinitis due to pollen: Secondary | ICD-10-CM

## 2016-01-02 NOTE — Assessment & Plan Note (Signed)
CPAP is set at 12 cm and is working well.  Weight loss encouraged, compliance with goal of at least 4-6 hrs every night is the expectation. Advised against medications with sedative side effects Cautioned against driving when sleepy - understanding that sleepiness will vary on a day to day basis  

## 2016-01-02 NOTE — Progress Notes (Signed)
   Subjective:    Patient ID: KNUTE MELA, male    DOB: March 16, 1966, 50 y.o.   MRN: [id number]  HPI  50 yo male seen for sleep consult June 2016   01/02/2016  Chief Complaint  Patient presents with  . Sleep Apnea    Doing well on CPAP machine, wears every night, but has sinus drainage into the chest, low grade fever last night.     Maintained on CPAP since November 2016 He change from full face mask and nasal pillows and this has worked well for him. He continues to breathe through his mouth and therefore now uses a chin strap. Download shows good compliance on 12 cm with no residuals and minimal leak  He had good improvement in his snoring and daytime somnolence and tiredness but his wife continues to sleep in a different room  He had a fever last night, got a cold from his son, reports clear drainage  Significant tests/ events  HST on 10/06/14 that showed mild OSA With AHI at 10.7/hr  CPAP titration study 01/2015>CPAP titration to 12 cwp, AHI The patient wore a medium Resmed AirFit F10 full face mask with heated humidifier.       Review of Systems Patient denies significant dyspnea,cough, hemoptysis,  chest pain, palpitations, pedal edema, orthopnea, paroxysmal nocturnal dyspnea, lightheadedness, nausea, vomiting, abdominal or  leg pains      Objective:   Physical Exam   Gen. Pleasant, obese, in no distress ENT - no lesions, no post nasal drip Neck: No JVD, no thyromegaly, no carotid bruits Lungs: no use of accessory muscles, no dullness to percussion, decreased without rales or rhonchi  Cardiovascular: Rhythm regular, heart sounds  normal, no murmurs or gallops, no peripheral edema Musculoskeletal: No deformities, no cyanosis or clubbing , no tremors        Assessment & Plan:

## 2016-01-02 NOTE — Assessment & Plan Note (Signed)
Treat as viral URI Call for an antibiotic if yellow-green sinus drainage or sputum production

## 2016-01-02 NOTE — Patient Instructions (Signed)
Your CPAP is set at 12 cm and is working well Call for an antibiotic if yellow-green sinus drainage or sputum production

## 2016-01-13 ENCOUNTER — Other Ambulatory Visit: Payer: Self-pay | Admitting: Internal Medicine

## 2016-01-13 ENCOUNTER — Telehealth: Payer: Self-pay | Admitting: Internal Medicine

## 2016-01-16 MED ORDER — IBUPROFEN 800 MG PO TABS: 800.0000 mg | ORAL_TABLET | Freq: Four times a day (QID) | ORAL | 0 refills | Status: DC | PRN

## 2016-01-16 MED ORDER — TRAMADOL HCL 50 MG PO TABS: ORAL_TABLET | ORAL | 0 refills | Status: DC

## 2016-01-16 NOTE — Telephone Encounter (Signed)
Both meds last filled 09-23-15 Tramadol #30 Ibuprofen #100  Last OV 06-01-15 No Future OV

## 2016-01-16 NOTE — Telephone Encounter (Signed)
Tramadol refill left on VM at pharmacy. Ibuprofen sent in electronically.  Message sent to Robin to schedule CPE

## 2016-01-16 NOTE — Telephone Encounter (Signed)
Approved:tramadol #30 x 0 Ibuprofen #100 x 3 Should schedule physical for next February or March

## 2016-01-18 NOTE — Telephone Encounter (Signed)
Appointment 3/23 pt aware

## 2016-03-07 ENCOUNTER — Other Ambulatory Visit: Payer: Self-pay | Admitting: Internal Medicine

## 2016-03-12 NOTE — Telephone Encounter (Signed)
Left refill on voice mail at pharmacy  

## 2016-03-12 NOTE — Telephone Encounter (Signed)
Last filled 01-16-16 #30 Last OV 06-01-15 Next OV 07-06-16

## 2016-03-12 NOTE — Telephone Encounter (Signed)
Approved: 30 x 0 

## 2016-07-06 ENCOUNTER — Encounter: Payer: Self-pay | Admitting: Internal Medicine

## 2016-07-06 ENCOUNTER — Ambulatory Visit (INDEPENDENT_AMBULATORY_CARE_PROVIDER_SITE_OTHER): Payer: BLUE CROSS/BLUE SHIELD | Admitting: Internal Medicine

## 2016-07-06 VITALS — BP 128/84 | HR 60 | Temp 98.1°F | Ht 73.5 in | Wt 229.0 lb

## 2016-07-06 DIAGNOSIS — R200 Anesthesia of skin: Secondary | ICD-10-CM

## 2016-07-06 DIAGNOSIS — Z0000 Encounter for general adult medical examination without abnormal findings: Secondary | ICD-10-CM

## 2016-07-06 DIAGNOSIS — J301 Allergic rhinitis due to pollen: Secondary | ICD-10-CM | POA: Diagnosis not present

## 2016-07-06 LAB — COMPREHENSIVE METABOLIC PANEL
ALK PHOS: 55 U/L (ref 39–117)
ALT: 31 U/L (ref 0–53)
AST: 18 U/L (ref 0–37)
Albumin: 4.9 g/dL (ref 3.5–5.2)
BILIRUBIN TOTAL: 0.4 mg/dL (ref 0.2–1.2)
BUN: 19 mg/dL (ref 6–23)
CALCIUM: 9.9 mg/dL (ref 8.4–10.5)
CO2: 28 meq/L (ref 19–32)
CREATININE: 0.94 mg/dL (ref 0.40–1.50)
Chloride: 103 mEq/L (ref 96–112)
GFR: 89.6 mL/min (ref >60.00)
Glucose, Bld: 107 mg/dL — ABNORMAL HIGH (ref 70–99)
Potassium: 4 mEq/L (ref 3.5–5.1)
Sodium: 140 mEq/L (ref 135–145)
TOTAL PROTEIN: 7.4 g/dL (ref 6.0–8.3)

## 2016-07-06 LAB — CBC WITH DIFFERENTIAL/PLATELET
BASOS PCT: 0.9 % (ref 0.0–3.0)
Basophils Absolute: 0.1 10*3/uL (ref 0.0–0.1)
Eosinophils Absolute: 0.1 10*3/uL (ref 0.0–0.7)
Eosinophils Relative: 1.2 % (ref 0.0–5.0)
HCT: 45.2 % (ref 39.0–52.0)
Hemoglobin: 15.4 g/dL (ref 13.0–17.0)
LYMPHS ABS: 3.3 10*3/uL (ref 0.7–4.0)
Lymphocytes Relative: 33.2 % (ref 12.0–46.0)
MCHC: 34 g/dL (ref 30.0–36.0)
MCV: 87 fl (ref 78.0–100.0)
MONO ABS: 0.6 10*3/uL (ref 0.1–1.0)
Monocytes Relative: 6.5 % (ref 3.0–12.0)
NEUTROS ABS: 5.7 10*3/uL (ref 1.4–7.7)
NEUTROS PCT: 58.2 % (ref 43.0–77.0)
Platelets: 234 10*3/uL (ref 150.0–400.0)
RBC: 5.19 Mil/uL (ref 4.22–5.81)
RDW: 12.9 % (ref 11.5–15.5)
WBC: 9.8 10*3/uL (ref 4.0–10.5)

## 2016-07-06 LAB — VITAMIN B12: Vitamin B-12: 439 pg/mL (ref 211–911)

## 2016-07-06 LAB — T4, FREE: FREE T4: 0.87 ng/dL (ref 0.60–1.60)

## 2016-07-06 MED ORDER — TRAMADOL HCL 50 MG PO TABS: ORAL_TABLET | ORAL | 0 refills | Status: DC

## 2016-07-06 NOTE — Assessment & Plan Note (Signed)
Doing okay on current regimen 

## 2016-07-06 NOTE — Assessment & Plan Note (Signed)
In feet He thinks it is mechanical Will check labs though

## 2016-07-06 NOTE — Progress Notes (Signed)
Subjective:    Patient ID: Joseph Jackson, male    DOB: Aug 08, 1965, 51 y.o.   MRN: [id number]  HPI Here for physical  Lost his job 3 weeks ago He is optimistic that he can get another job quickly This has been emotionally difficult---awakening in panic, etc Wife has worsened recently--- since RMSF.  Repetitive rash around nose Uses the ointments with success Not much scalp symptoms--but discussed trying T-gel or other medicated shampoo  Has been losing feeling in balls of feet and toes Dad had same thing--trying to get better support Occasional shooting pains--can't walk in bare feet  Chronic back pain but intermittent Uses ibuprofen--and also tramadol   Current Outpatient Prescriptions on File Prior to Visit  Medication Sig Dispense Refill  . albuterol (PROVENTIL HFA;VENTOLIN HFA) 108 (90 BASE) MCG/ACT inhaler Inhale 2 puffs into the lungs 3 (three) times daily as needed. 1 Inhaler 0  . calcipotriene (DOVONOX) 0.005 % cream Apply topically as needed. 60 g 1  . clobetasol ointment (TEMOVATE) 7.41 % Apply 1 application topically as needed. 30 g 1  . fexofenadine (ALLEGRA) 180 MG tablet Take 180 mg by mouth daily as needed for allergies or rhinitis.    . fluticasone (FLONASE) 50 MCG/ACT nasal spray Place 2 sprays into the nose daily.    Marland Kitchen ibuprofen (ADVIL,MOTRIN) 800 MG tablet TAKE 1 TABLET (800 MG TOTAL) BY MOUTH EVERY 6 (SIX) HOURS AS NEEDED. 100 tablet 3  . traMADol (ULTRAM) 50 MG tablet TAKE 1 TABLET BY MOUTH 3 TIMES DAILY AS NEEDED FOR PAIN 30 tablet 0   No current facility-administered medications on file prior to visit.     Allergies  Allergen Reactions  . Aspirin     REACTION: swelling    Past Medical History:  Diagnosis Date  . Allergy   . Anxiety   . Dermatitis due to drugs and medicines taken internally(693.0)   . GERD (gastroesophageal reflux disease)   . Nephrolithiasis 6/16   Passed kidney stone  . Pure hyperglyceridemia   . Unspecified sleep apnea       Past Surgical History:  Procedure Laterality Date  . US ECHOCARDIOGRAPHY  10/07   normal, holter benign   . WISDOM TOOTH EXTRACTION      Family History  Problem Relation Age of Onset  . Cancer Mother   . Stroke Father   . Heart disease Father     MI due to aortic tear  . Mental illness Sister   . Diabetes Neg Hx     Social History   Social History  . Marital status: Married    Spouse name: N/A  . Number of children: 2  . 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 Smoking status: Never Smoker  . Smokeless tobacco: Never Used  . Alcohol use No     Comment: occasional  . Drug use: No  . Sexual activity: Not on file   Other Topics Concern  . Not on file   Social History Narrative  . No narrative on file   Review of Systems  Constitutional:       Has lost a few pounds--careful with eating Weight [employer]cher's app Wears seat belt  HENT: Positive for tinnitus. Negative for dental problem and hearing loss.        Overdue for dentist  Eyes: Negative for visual disturbance.       No diplopia or unilateral  vision loss  Respiratory: Positive for cough. Negative for chest tightness and shortness of breath.        Allergy cough  Cardiovascular: Positive for palpitations. Negative for chest pain and leg swelling.  Gastrointestinal: Negative for abdominal pain.       Irritable bowel at times--diarrhea. Occasional blood on toilet paper--hemorrhoid  Endocrine: Negative for polydipsia and polyuria.  Genitourinary: Negative for difficulty urinating and urgency.       Mild ED--less stamina  Musculoskeletal: Positive for back pain. Negative for arthralgias and joint swelling.  Skin: Positive for rash.  Allergic/Immunologic: Positive for environmental allergies. Negative for immunocompromised state.  Neurological: Negative for dizziness, syncope and light-headedness.  Hematological: Does not bruise/bleed  easily.       Posterior cervical nodes with allergies  Psychiatric/Behavioral: The patient is nervous/anxious.        Sleeps fairly well on CPAP (12cm?). Uses every night. Using nasal canula Some mood issues with losing his job       Objective:   Physical Exam  Constitutional: He is oriented to person, place, and time. He appears well-nourished. No distress.  HENT:  Head: Normocephalic and atraumatic.  Right Ear: External ear normal.  Left Ear: External ear normal.  Mouth/Throat: Oropharynx is clear and moist. No oropharyngeal exudate.  Eyes: Conjunctivae are normal. Pupils are equal, round, and reactive to light.  Neck: Normal range of motion. Neck supple. No thyromegaly present.  Cardiovascular: Normal rate, regular rhythm, normal heart sounds and intact distal pulses.  Exam reveals no gallop.   No murmur heard. Pulmonary/Chest: Effort normal and breath sounds normal. No respiratory distress. He has no wheezes. He has no rales.  Abdominal: Soft. There is no tenderness.  Musculoskeletal: He exhibits no edema or tenderness.  Lymphadenopathy:    He has no cervical adenopathy.  Neurological: He is alert and oriented to person, place, and time.  Skin: No rash noted. No erythema.  Psychiatric: He has a normal mood and affect. His behavior is normal.          Assessment & Plan:

## 2016-07-06 NOTE — Progress Notes (Signed)
Pre visit review using our clinic review tool, if applicable. No additional management support is needed unless otherwise documented below in the visit note. 

## 2016-07-06 NOTE — Assessment & Plan Note (Signed)
Healthy Discussed working on fitness Defer PSA FIT--- needs to do right away

## 2016-08-23 ENCOUNTER — Other Ambulatory Visit: Payer: Self-pay | Admitting: Internal Medicine

## 2016-08-23 NOTE — Telephone Encounter (Signed)
Last office visit 07/06/2016.  Last refilled 07/06/2016 for #30 with no refills.  Ok to refill?

## 2016-08-24 NOTE — Telephone Encounter (Signed)
Approved: 30 x 0 

## 2016-08-24 NOTE — Telephone Encounter (Signed)
Left refill on voice mail at pharmacy  

## 2016-09-16 ENCOUNTER — Encounter: Payer: Self-pay | Admitting: Internal Medicine

## 2016-09-17 ENCOUNTER — Telehealth: Payer: Self-pay

## 2016-09-17 NOTE — Telephone Encounter (Signed)
I called the [facility] and they have no record of it, please have the pt recollect. No charges until test is done. Thanks, Karna Christmas

## 2016-09-17 NOTE — Telephone Encounter (Signed)
Pt sent a MyChart message asking about FOB stool sample test result. See Terri's message below.

## 2016-09-20 ENCOUNTER — Ambulatory Visit (INDEPENDENT_AMBULATORY_CARE_PROVIDER_SITE_OTHER)
Admission: RE | Admit: 2016-09-20 | Discharge: 2016-09-20 | Disposition: A | Discharge status: Home / Self Care | Payer: BLUE CROSS/BLUE SHIELD | Source: Ambulatory Visit | Attending: Family Medicine | Admitting: Family Medicine

## 2016-09-20 ENCOUNTER — Encounter: Payer: Self-pay | Admitting: Family Medicine

## 2016-09-20 ENCOUNTER — Ambulatory Visit (INDEPENDENT_AMBULATORY_CARE_PROVIDER_SITE_OTHER): Payer: BLUE CROSS/BLUE SHIELD | Admitting: Family Medicine

## 2016-09-20 VITALS — BP 140/94 | HR 70 | Temp 98.5°F | Ht 73.5 in | Wt 228.5 lb

## 2016-09-20 DIAGNOSIS — T148XXA Other injury of unspecified body region, initial encounter: Secondary | ICD-10-CM

## 2016-09-20 DIAGNOSIS — M25562 Pain in left knee: Secondary | ICD-10-CM

## 2016-09-20 DIAGNOSIS — S76012A Strain of muscle, fascia and tendon of left hip, initial encounter: Secondary | ICD-10-CM | POA: Diagnosis not present

## 2016-09-20 NOTE — Progress Notes (Addendum)
Dr. Frederico Hamman T. Lillyahna Hemberger, MD, [facility] and Sports Medicine [place], [address] Phone: [phone] Fax: [phone]  09/20/2016  Patient: Joseph Jackson, MRN: [id number], DOB: [birth year], 51 y.o.  Primary Physician:  Venia Carbon, MD   Chief Complaint  Patient presents with  . Knee Pain    Left Fell at [employer] 09/15/16   Subjective:   Joseph Jackson is a 51 y.o. very pleasant male patient who presents with the following:  DOI: 09/15/2017  At [employer], fell over yellow area, out and back and felt a popping sensation.  Then fell down on his side, left leg has been tender. He fell and landed on the right side of his body. He felt some pain in his knee while he was falling and a popping sensation. He also had pain in the anterior hip.  He did not strike his head, and did not directly strike the knee, femur, or tibia.  No significant history for knee or hip injuries in the past and no significant surgical history of the affected joints.  Past Medical History, Surgical History, Social History, Family History, Problem List, Medications, and Allergies have been reviewed and updated if relevant.  Patient Active Problem List   Diagnosis Date Noted  . Preventative health care 06/01/2015  . Nephrolithiasis   . OSA (obstructive sleep apnea) 05/27/2014  . Sensory loss 01/07/2012  . ANXIETY 11/28/2006  . Allergic rhinitis due to pollen 11/28/2006  . GERD 07/25/2006  . HYPERTRIGLYCERIDEMIA 04/18/1999    Past Medical History:  Diagnosis Date  . Allergy   . Anxiety   . Dermatitis due to drugs and medicines taken internally(693.0)   . GERD (gastroesophageal reflux disease)   . Nephrolithiasis 6/16   Passed kidney stone  . Pure hyperglyceridemia   . Unspecified sleep apnea     Past Surgical History:  Procedure Laterality Date  . US ECHOCARDIOGRAPHY  10/07   normal, holter benign   . WISDOM TOOTH EXTRACTION      Social History    Social History  . Marital status: Married    Spouse name: N/A  . Number of children: 2  . 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 Smoking status: Never Smoker  . Smokeless tobacco: Never Used  . Alcohol use No     Comment: occasional  . Drug use: No  . Sexual activity: Not on file   Other Topics Concern  . Not on file   Social History Narrative  . No narrative on file    Family History  Problem Relation Age of Onset  . Cancer Mother   . Stroke Father   . Heart disease Father        MI due to aortic tear  . Mental illness Sister   . Diabetes Neg Hx     Allergies  Allergen Reactions  . Aspirin     REACTION: swelling    Medication list reviewed and updated in full in [place].  GEN: No fevers, chills. Nontoxic. Primarily MSK c/o today. MSK: Detailed in the HPI GI: tolerating PO intake without difficulty Neuro: No numbness, parasthesias, or tingling associated. Otherwise the pertinent positives of the ROS are noted above.   Objective:   BP (!) 140/94   Pulse 70   Temp 98.5 F (36.9 C) (Oral)   Ht 6' 1.5" (1.867 m)  Wt 228 lb 8 oz (103.6 kg)   BMI 29.74 kg/m    GEN: WDWN, NAD, Non-toxic, Alert & Oriented x 3 HEENT: Atraumatic, Normocephalic.  Ears and Nose: No external deformity. EXTR: No clubbing/cyanosis/edema NEURO: Normal gait.  Mild antalgia PSYCH: Normally interactive. Conversant. Not depressed or anxious appearing.  Calm demeanor.   HIP EXAM: SIDE: L ROM: Abduction, Flexion, Internal and External range of motion: full Pain with terminal IROM and EROM: minimal to none GTB: NT SLR: NEG Knees: No effusion FABER: NT REVERSE FABER: NT, neg Piriformis: NT at direct palpation Pain with palpation adjacent to ASIS in muscle Str: flexion: 4/5 abduction: 5/5 adduction: 5/5 Strength testing non-tender  Knee: Medial bruising. Excellent motion at the patella with  nontender. MCL and LCL appear intact and stressed does not cause pain. Lachman is negative. Anterior and posterior drawer testing is negative. McMurray's is negative. Mild pain on flexion pinch. Bounce home test is negative.  He does have some tenderness around the insertion of the sartorius.  Radiology:  Assessment and Plan:   Acute pain of left knee - Plan: DG Knee Complete 4 Views Left  Strain of hip flexor, left, initial encounter  Bruise  >25 minutes spent in face to face time with patient, >50% spent in counselling or coordination of care   4 view trauma series, left knee, independent review by myself shows a Sherald Barge is present, but no evidence of fracture, dislocation and no significant loss of joint space.  Hip flexor and sartorius strain, acute.  Relative rest for the next 10-14 days, then begin more aggressive rehabilitation. He has access to a pool and can do some gentle rehab now.  Addendum, 09/20/2016 I spoke to the interpreting radiologist myself personally. There does appear to be a very small avulsion fracture as noted on secondary review. I'm not sure if this changes the clinical scenario, and we can conservatively progress this with recheck in a reliable patient. Electronically Signed  By: Owens Loffler, MD On: 09/20/2016 1:43 PM   Dg Knee Complete 4 Views Left  Result Date: 09/20/2016 CLINICAL DATA:  Pain following fall EXAM: LEFT KNEE - COMPLETE 4+ VIEW COMPARISON:  None. FINDINGS: Frontal, lateral, bilateral oblique, and sunrise patellar images were obtained. There is focal calcification in the anterolateral aspect of the medial compartment, concerning for avulsion from the tibial plateau. No other evidence suggesting fracture. No dislocation. There is a slight joint effusion. There is no appreciable joint space narrowing or erosion. IMPRESSION: Findings concerning for small avulsion arising from the anterolateral aspect of the medial tibial plateau. Small joint  effusion. No other evidence of potential fracture. No dislocation or arthropathy. These results will be called to the ordering clinician or representative by the Radiologist Assistant, and communication documented in the PACS or zVision Dashboard. Electronically Signed   By: Lowella Grip III M.D.   On: 09/20/2016 10:02     Follow-up: Return in about 4 weeks (around 10/18/2016).  Future Appointments Date Time Provider Eva  10/18/2016 11:00 AM Arria Naim, Frederico Hamman, MD [facility]   Orders Placed This Encounter  Procedures  . DG Knee Complete 4 Views Left    Signed,  Zuria Fosdick T. Houa Nie, MD   Allergies as of 09/20/2016      Reactions   Aspirin    REACTION: swelling      Medication List       Accurate as of 09/20/16 10:05 AM. Always use your most recent med list.  albuterol 108 (90 Base) MCG/ACT inhaler Commonly known as:  PROVENTIL HFA;VENTOLIN HFA Inhale 2 puffs into the lungs 3 (three) times daily as needed.   calcipotriene 0.005 % cream Commonly known as:  DOVONOX Apply topically as needed.   clobetasol ointment 0.05 % Commonly known as:  TEMOVATE Apply 1 application topically as needed.   fexofenadine 180 MG tablet Commonly known as:  ALLEGRA Take 180 mg by mouth daily as needed for allergies or rhinitis.   fluticasone 50 MCG/ACT nasal spray Commonly known as:  FLONASE Place 2 sprays into the nose daily.   ibuprofen 800 MG tablet Commonly known as:  ADVIL,MOTRIN TAKE 1 TABLET (800 MG TOTAL) BY MOUTH EVERY 6 (SIX) HOURS AS NEEDED.   traMADol 50 MG tablet Commonly known as:  ULTRAM TAKE 1 TABLET BY MOUTH 3 TIMES A DAY AS NEEDED FOR PAIN

## 2016-09-27 IMAGING — CR DG LUMBAR SPINE COMPLETE 4+V
5 series · 5 of 5 positions shown · non-contrast
Comparison: None.

CLINICAL DATA: Recent fall at work with lower back pain, initial
encounter

EXAM:
LUMBAR SPINE - COMPLETE 4+ VIEW

[AP]
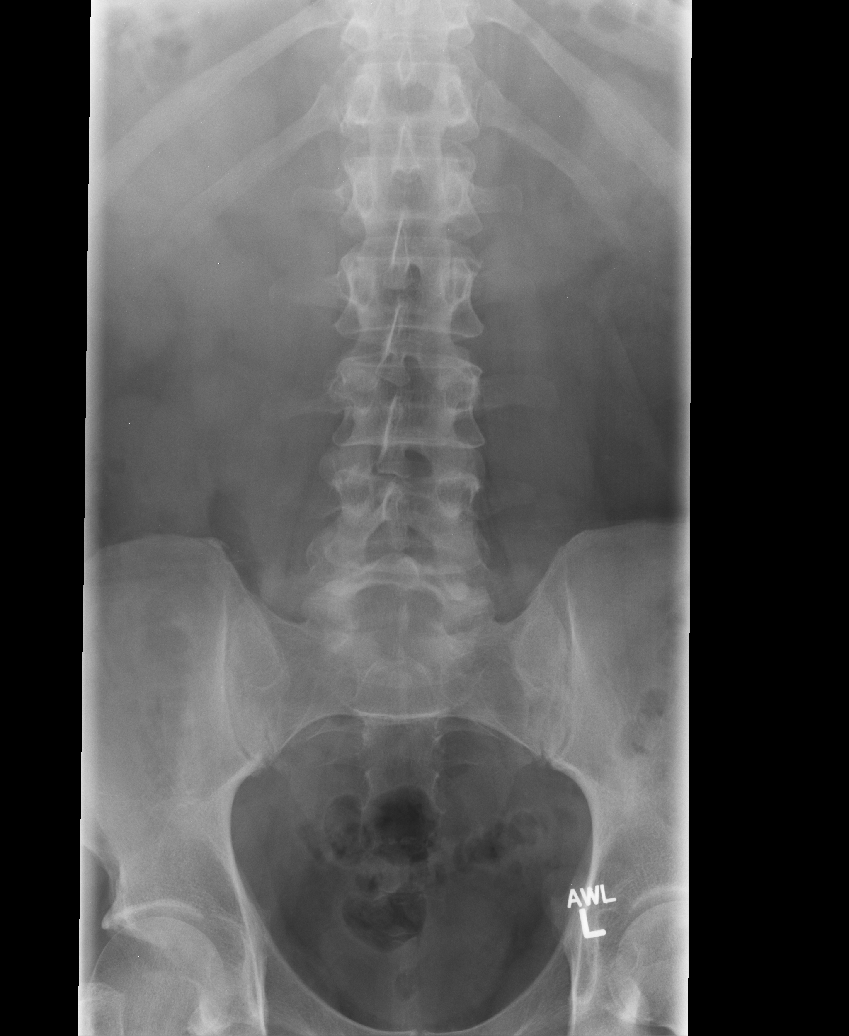

[rpo]
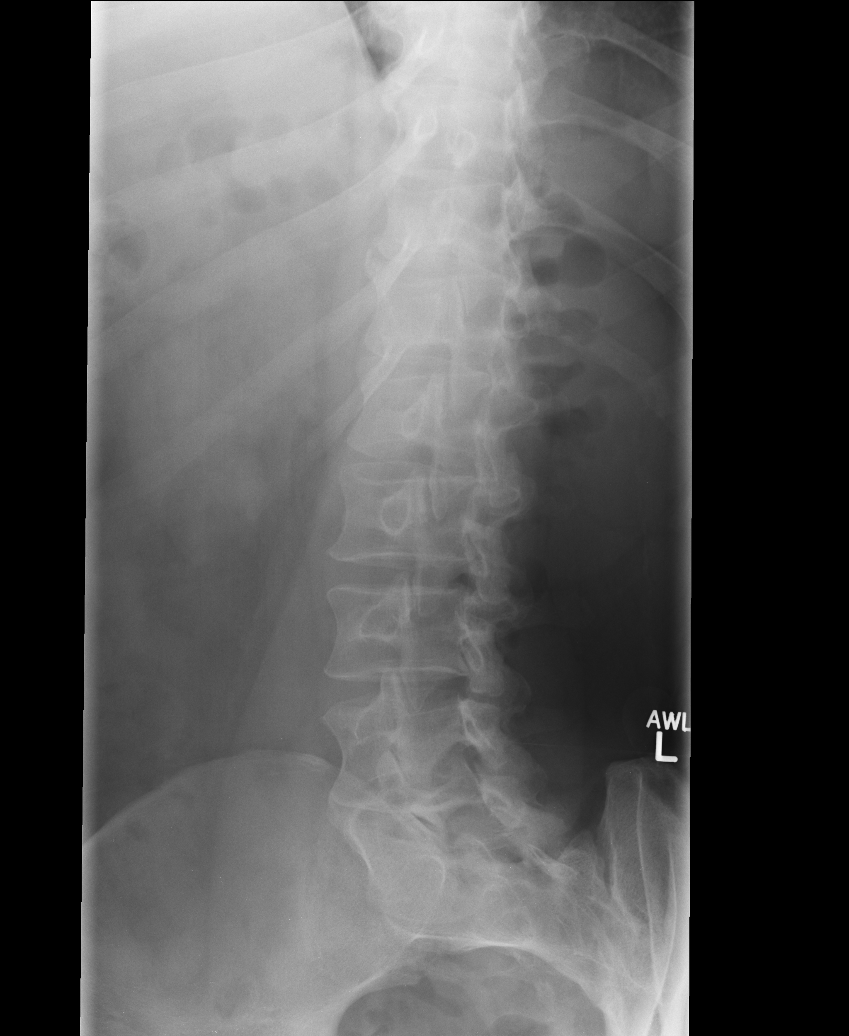

[lpo]
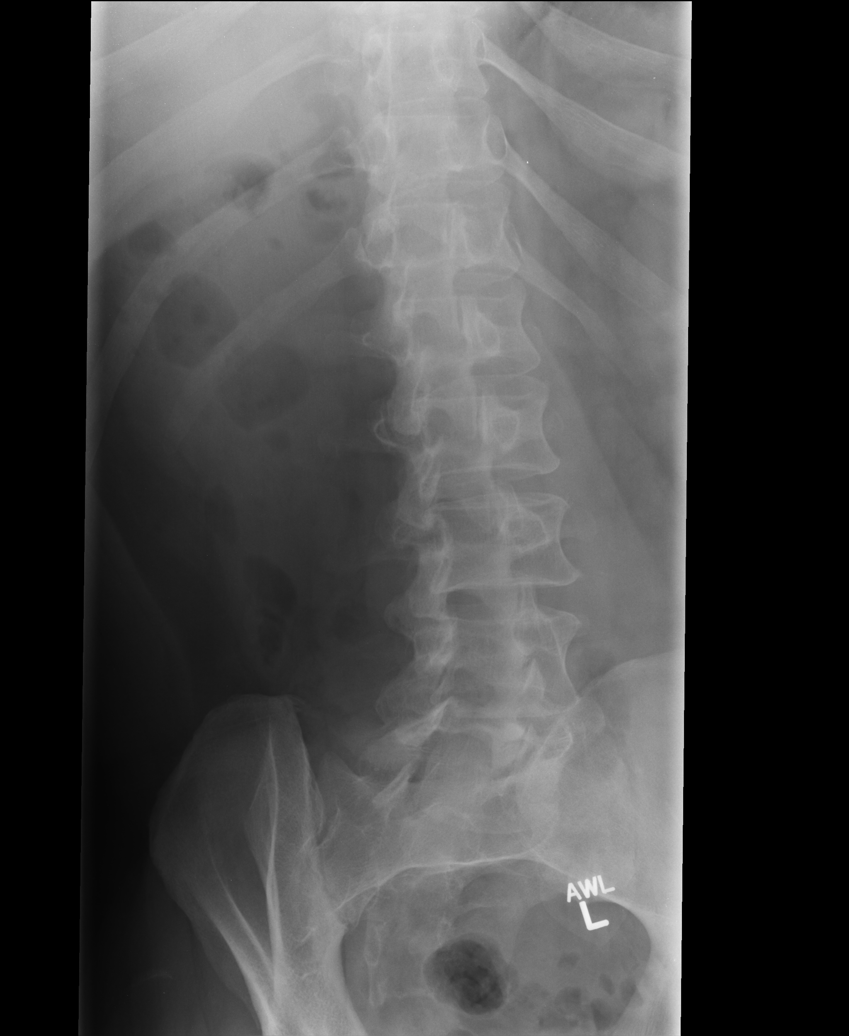

[lateral]
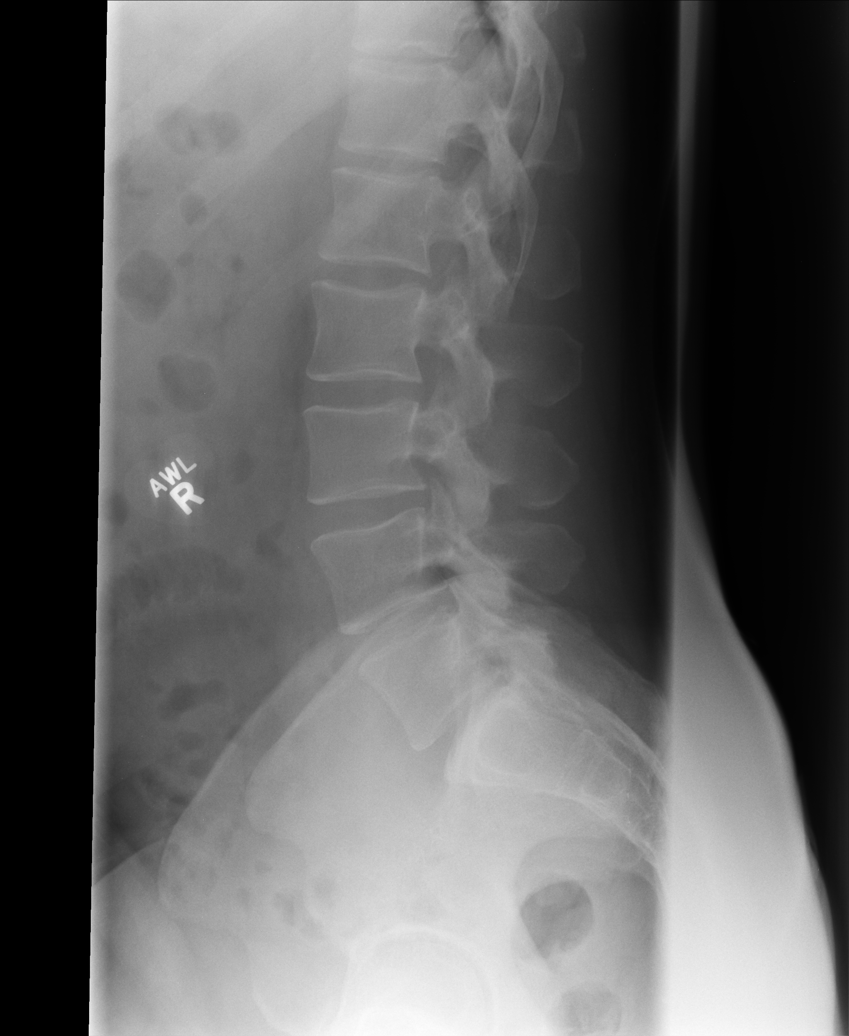

[l5 s1]
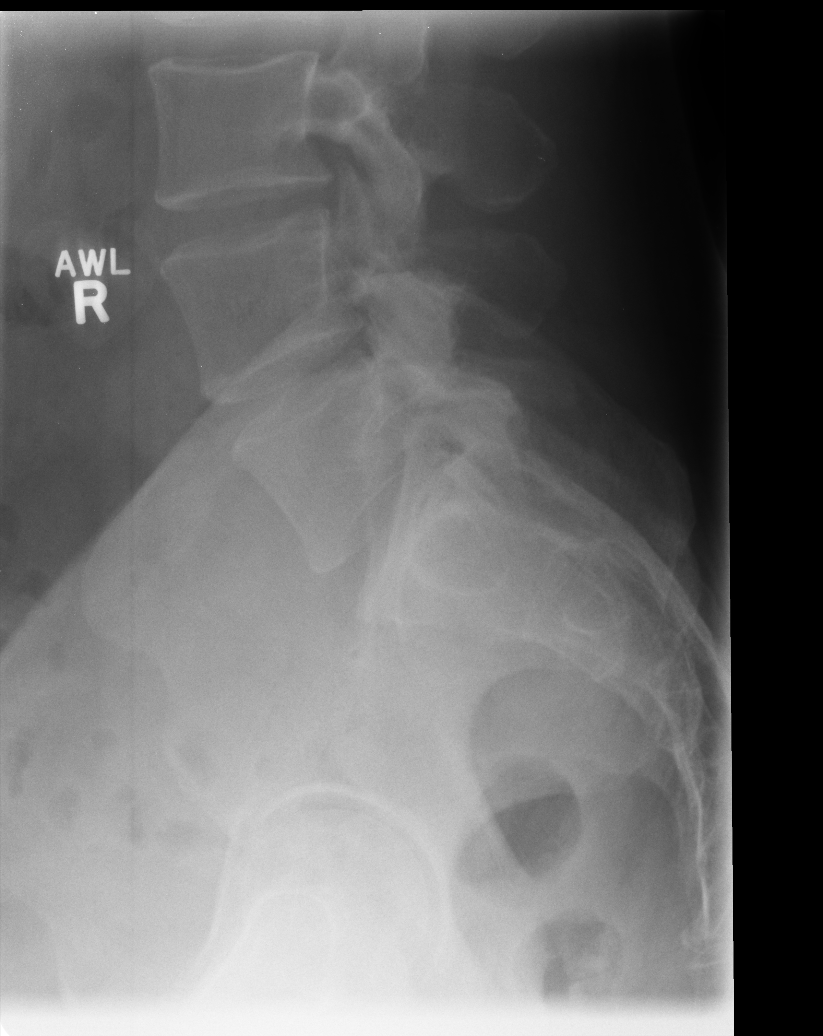

[5 of 5 positions shown; findings below may reference images not displayed]

FINDINGS: Vertebral body height is well maintained. Mild disc space narrowing
is noted at L4-5 and L5-S1. No pars defects or spondylolisthesis is
noted. No acute soft tissue abnormality is noted.
IMPRESSION: Mild degenerative change without acute abnormality.

## 2016-10-18 ENCOUNTER — Ambulatory Visit (INDEPENDENT_AMBULATORY_CARE_PROVIDER_SITE_OTHER): Payer: BLUE CROSS/BLUE SHIELD | Admitting: Family Medicine

## 2016-10-18 ENCOUNTER — Encounter: Payer: Self-pay | Admitting: Family Medicine

## 2016-10-18 VITALS — BP 140/98 | HR 66 | Temp 98.4°F | Ht 73.5 in | Wt 230.8 lb

## 2016-10-18 DIAGNOSIS — M25562 Pain in left knee: Secondary | ICD-10-CM | POA: Diagnosis not present

## 2016-10-18 NOTE — Progress Notes (Signed)
Dr. Frederico Hamman T. Quintel Mccalla, MD, [facility] Primary [facility] and Sports Medicine [place], [address] Phone: [phone] Fax: [phone]  10/18/2016  Patient: Joseph Jackson, MRN: [id number], DOB: 11-07-1965, 51 y.o.  Primary Physician:  Venia Carbon, MD   Chief Complaint  Patient presents with  . Follow-up    Left Knee   Subjective:   Joseph Jackson is a 51 y.o. very pleasant male patient who presents with the following:  F/u L knee  DOI: 09/15/2016, initially he described a fall [place] [employer].  No restriction of mobility, pain has gradually been getting better. Too much - it will hurt. 75% better. He is able to walk comfortably, but he has not attempted to resume exercise.  He does not have any bruising at this point, and he does not have an effusion.  I reviewed the patient's x-rays with him face-to-face in a small showed him the small avulsion fracture in the anterolateral aspect of the medial tibial plateau.  The pain he was having in his anterior hip is completely resolved.  Past Medical History, Surgical History, Social History, Family History, Problem List, Medications, and Allergies have been reviewed and updated if relevant.  Patient Active Problem List   Diagnosis Date Noted  . Preventative health care 06/01/2015  . Nephrolithiasis   . OSA (obstructive sleep apnea) 05/27/2014  . Sensory loss 01/07/2012  . ANXIETY 11/28/2006  . Allergic rhinitis due to pollen 11/28/2006  . GERD 07/25/2006  . HYPERTRIGLYCERIDEMIA 04/18/1999    Past Medical History:  Diagnosis Date  . Allergy   . Anxiety   . Dermatitis due to drugs and medicines taken internally(693.0)   . GERD (gastroesophageal reflux disease)   . Nephrolithiasis 6/16   Passed kidney stone  . Pure hyperglyceridemia   . Unspecified sleep apnea     Past Surgical History:  Procedure Laterality Date  . US ECHOCARDIOGRAPHY  10/07   normal, holter benign   . WISDOM TOOTH  EXTRACTION      Social History   Social History  . Marital status: Married    Spouse name: N/A  . Number of children: 2  . 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 Smoking status: Never Smoker  . Smokeless tobacco: Never Used  . Alcohol use No     Comment: occasional  . Drug use: No  . Sexual activity: Not on file   Other Topics Concern  . Not on file   Social History Narrative  . No narrative on file    Family History  Problem Relation Age of Onset  . Cancer Mother   . Stroke Father   . Heart disease Father        MI due to aortic tear  . Mental illness Sister   . Diabetes Neg Hx     Allergies  Allergen Reactions  . Aspirin     REACTION: swelling    Medication list reviewed and updated in full in [place].  GEN: No fevers, chills. Nontoxic. Primarily MSK c/o today. MSK: Detailed in the HPI GI: tolerating PO intake without difficulty Neuro: No numbness, parasthesias, or tingling associated. Otherwise the pertinent positives of the ROS are noted above.   Objective:   BP (!) 140/98   Pulse 66   Temp 98.4 F (36.9 C) (Oral)   Ht 6' 1.5" (1.867 m)   Wt  230 lb 12 oz (104.7 kg)   BMI 30.03 kg/m    GEN: WDWN, NAD, Non-toxic, Alert & Oriented x 3 HEENT: Atraumatic, Normocephalic.  Ears and Nose: No external deformity. EXTR: No clubbing/cyanosis/edema NEURO: Normal gait.  PSYCH: Normally interactive. Conversant. Not depressed or anxious appearing.  Calm demeanor.    Left knee: No effusion.  Full extension.  Flexion to 125.  No tenderness on the medial joint line and no tenderness on the lateral joint line.  MCL and LCL are stable.  Lachman is negative.  Anterior drawer is negative.  Posterior drawer is negative.  Flexion pinch causes some pain.  McMurray's causes mild pain only.  Minimal to mild pain with tapping of the medial tibial plateau.  No pain at the proximal fibula.   No pain with compression of the tibia and fibula.  No pain at the patella with manipulation.  Negative apprehension sign.  Radiology: Dg Knee Complete 4 Views Left  Result Date: 09/20/2016 CLINICAL DATA:  Pain following fall EXAM: LEFT KNEE - COMPLETE 4+ VIEW COMPARISON:  None. FINDINGS: Frontal, lateral, bilateral oblique, and sunrise patellar images were obtained. There is focal calcification in the anterolateral aspect of the medial compartment, concerning for avulsion from the tibial plateau. No other evidence suggesting fracture. No dislocation. There is a slight joint effusion. There is no appreciable joint space narrowing or erosion. IMPRESSION: Findings concerning for small avulsion arising from the anterolateral aspect of the medial tibial plateau. Small joint effusion. No other evidence of potential fracture. No dislocation or arthropathy. These results will be called to the ordering clinician or representative by the Radiologist Assistant, and communication documented in the PACS or zVision Dashboard. Electronically Signed   By: Lowella Grip III M.D.   On: 09/20/2016 10:02    Assessment and Plan:   Acute pain of left knee  Avulsion fracture of the medial tibial plateau is healing and doing very well.  I'm going to recheck him to make sure this is completely resolved in approximately 5 weeks.  Follow-up: Return in about 5 weeks (around 11/22/2016) for 5-6 weeks with Dr. Lorelei Pont.  Future Appointments Date Time Provider [place]  11/28/2016 3:30 PM Delvis Kau, Frederico Hamman, MD [facility]    Meds ordered this encounter  Medications  . olopatadine (PATANOL) 0.1 % ophthalmic solution    Sig: Place 1 drop into both eyes 2 (two) times daily.    Signed,  [name]. Alsha Meland, MD   Allergies as of 10/18/2016      Reactions   Aspirin    REACTION: swelling      Medication List       Accurate as of 10/18/16 11:59 PM. Always use your most recent med list.            albuterol 108 (90 Base) MCG/ACT inhaler Commonly known as:  PROVENTIL HFA;VENTOLIN HFA Inhale 2 puffs into the lungs 3 (three) times daily as needed.   calcipotriene 0.005 % cream Commonly known as:  DOVONOX Apply topically as needed.   clobetasol ointment 0.05 % Commonly known as:  TEMOVATE Apply 1 application topically as needed.   fexofenadine 180 MG tablet Commonly known as:  ALLEGRA Take 180 mg by mouth daily as needed for allergies or rhinitis.   fluticasone 50 MCG/ACT nasal spray Commonly known as:  FLONASE Place 2 sprays into the nose daily.   ibuprofen 800 MG tablet Commonly known as:  ADVIL,MOTRIN TAKE 1 TABLET (800 MG TOTAL) BY MOUTH EVERY 6 (SIX) HOURS AS  NEEDED.   olopatadine 0.1 % ophthalmic solution Commonly known as:  PATANOL Place 1 drop into both eyes 2 (two) times daily.   traMADol 50 MG tablet Commonly known as:  ULTRAM TAKE 1 TABLET BY MOUTH 3 TIMES A DAY AS NEEDED FOR PAIN

## 2016-11-08 ENCOUNTER — Other Ambulatory Visit: Payer: Self-pay | Admitting: Internal Medicine

## 2016-11-09 NOTE — Telephone Encounter (Signed)
Left refill on voice mail at pharmacy  

## 2016-11-09 NOTE — Telephone Encounter (Signed)
plz phone in. 

## 2016-11-09 NOTE — Telephone Encounter (Signed)
Last filled 08-24-16 #30 Last OV 10-18-16 Acute Next OV 11-28-16  Forward to Dr Darnell Level in Dr Alla German absence. Please send back to [facility] when approved/denied. Thanks

## 2016-11-28 ENCOUNTER — Ambulatory Visit: Payer: Self-pay | Admitting: Family Medicine

## 2016-11-28 DIAGNOSIS — Z0289 Encounter for other administrative examinations: Secondary | ICD-10-CM

## 2016-12-05 ENCOUNTER — Encounter: Payer: Self-pay | Admitting: Family Medicine

## 2016-12-09 ENCOUNTER — Other Ambulatory Visit: Payer: Self-pay | Admitting: Internal Medicine

## 2016-12-10 ENCOUNTER — Ambulatory Visit (INDEPENDENT_AMBULATORY_CARE_PROVIDER_SITE_OTHER): Payer: BLUE CROSS/BLUE SHIELD | Admitting: Family Medicine

## 2016-12-10 ENCOUNTER — Encounter: Payer: Self-pay | Admitting: Family Medicine

## 2016-12-10 VITALS — BP 140/90 | HR 74 | Temp 98.3°F | Ht 73.5 in | Wt 234.8 lb

## 2016-12-10 DIAGNOSIS — M25562 Pain in left knee: Secondary | ICD-10-CM

## 2016-12-10 NOTE — Telephone Encounter (Signed)
Okay to fill #30 x 0 depending on if Dr Lorelei Pont gives him something else

## 2016-12-10 NOTE — Progress Notes (Signed)
Dr. Frederico Hamman T. Arnecia Ector, MD, [facility] Primary [facility] and Sports Medicine [place], [address] Phone: [phone] Fax: [phone]  12/10/2016  Patient: Joseph Jackson, MRN: [id number], DOB: [birth year], 51 y.o.  Primary Physician:  Venia Carbon, MD   Chief Complaint  Patient presents with  . Follow-up    Left Knee   Subjective:   [name] is a 51 y.o. very pleasant male patient who presents with the following:  L knee:   DOI : 09/15/2016  He did have a small avulsion fracture and in the anterolateral aspect of the medial tibial plateau. He has continued to improve, now he is essentially better with full motion, and he is able to get in and out of a car without difficulty. He does have an occasional ache when it is cold.  Dorsal foot numbness: This is been an ongoing problem that he has previously seen his primary care provider for. Prior workup has been negative.  Results for orders placed or performed in visit on 07/06/16  Comprehensive metabolic panel  Result Value Ref Range   Sodium 140 135 - 145 mEq/L   Potassium 4.0 3.5 - 5.1 mEq/L   Chloride 103 96 - 112 mEq/L   CO2 28 19 - 32 mEq/L   Glucose, Bld 107 (H) 70 - 99 mg/dL   BUN 19 6 - 23 mg/dL   Creatinine, Ser 0.94 0.40 - 1.50 mg/dL   Total Bilirubin 0.4 0.2 - 1.2 mg/dL   Alkaline Phosphatase 55 39 - 117 U/L   AST 18 0 - 37 U/L   ALT 31 0 - 53 U/L   Total Protein 7.4 6.0 - 8.3 g/dL   Albumin 4.9 3.5 - 5.2 g/dL   Calcium 9.9 8.4 - 10.5 mg/dL   GFR 89.60 >60.00 mL/min  CBC with Differential/Platelet  Result Value Ref Range   WBC 9.8 4.0 - 10.5 K/uL   RBC 5.19 4.22 - 5.81 Mil/uL   Hemoglobin 15.4 13.0 - 17.0 g/dL   HCT 45.2 39.0 - 52.0 %   MCV 87.0 78.0 - 100.0 fl   MCHC 34.0 30.0 - 36.0 g/dL   RDW 12.9 11.5 - 15.5 %   Platelets 234.0 150.0 - 400.0 K/uL   Neutrophils Relative % 58.2 43.0 - 77.0 %   Lymphocytes Relative 33.2 12.0 - 46.0 %   Monocytes Relative 6.5 3.0 -  12.0 %   Eosinophils Relative 1.2 0.0 - 5.0 %   Basophils Relative 0.9 0.0 - 3.0 %   Neutro Abs 5.7 1.4 - 7.7 K/uL   Lymphs Abs 3.3 0.7 - 4.0 K/uL   Monocytes Absolute 0.6 0.1 - 1.0 K/uL   Eosinophils Absolute 0.1 0.0 - 0.7 K/uL   Basophils Absolute 0.1 0.0 - 0.1 K/uL  T4, free  Result Value Ref Range   Free T4 0.87 0.60 - 1.60 ng/dL  Vitamin B12  Result Value Ref Range   Vitamin B-12 439 211 - 911 pg/mL     Past Medical History, Surgical History, Social History, Family History, Problem List, Medications, and Allergies have been reviewed and updated if relevant.  Patient Active Problem List   Diagnosis Date Noted  . Preventative health care 06/01/2015  . Nephrolithiasis   . OSA (obstructive sleep apnea) 05/27/2014  . Sensory loss 01/07/2012  . ANXIETY 11/28/2006  . Allergic rhinitis due to pollen 11/28/2006  . GERD 07/25/2006  . HYPERTRIGLYCERIDEMIA 04/18/1999    Past Medical History:  Diagnosis  Date  . Allergy   . Anxiety   . Dermatitis due to drugs and medicines taken internally(693.0)   . GERD (gastroesophageal reflux disease)   . Nephrolithiasis 6/16   Passed kidney stone  . Pure hyperglyceridemia   . Unspecified sleep apnea     Past Surgical History:  Procedure Laterality Date  . US ECHOCARDIOGRAPHY  10/07   normal, holter benign   . WISDOM TOOTH EXTRACTION      Social History   Social History  . Marital status: Married    Spouse name: N/A  . Number of children: 2  . 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 Smoking status: Never Smoker  . Smokeless tobacco: Never Used  . Alcohol use No     Comment: occasional  . Drug use: No  . Sexual activity: Not on file   Other Topics Concern  . Not on file   Social History Narrative  . No narrative on file    Family History  Problem Relation Age of Onset  . Cancer Mother   . Stroke Father   . Heart disease Father        MI  due to aortic tear  . Mental illness Sister   . Diabetes Neg Hx     Allergies  Allergen Reactions  . Aspirin     REACTION: swelling    Medication list reviewed and updated in full in [place].  GEN: No fevers, chills. Nontoxic. Primarily MSK c/o today. MSK: Detailed in the HPI GI: tolerating PO intake without difficulty Neuro: No numbness, parasthesias, or tingling associated. Otherwise the pertinent positives of the ROS are noted above.   Objective:   BP 140/90   Pulse 74   Temp 98.3 F (36.8 C) (Oral)   Ht 6' 1.5" (1.867 m)   Wt 234 lb 12 oz (106.5 kg)   BMI 30.55 kg/m    GEN: WDWN, NAD, Non-toxic, Alert & Oriented x 3 HEENT: Atraumatic, Normocephalic.  Ears and Nose: No external deformity. EXTR: No clubbing/cyanosis/edema NEURO: Normal gait.  PSYCH: Normally interactive. Conversant. Not depressed or anxious appearing.  Calm demeanor.   Knee:  L Gait: Normal heel toe pattern ROM: 0-130 Effusion: neg Echymosis or edema: none Patellar tendon NT Painful PLICA: neg Patellar grind: negative Medial and lateral patellar facet loading: negative medial and lateral joint lines:NT Mcmurray's neg Flexion-pinch neg Varus and valgus stress: stable Lachman: neg Ant and Post drawer: neg Hip abduction, IR, ER: WNL Hip flexion str: 5/5 Hip abd: 5/5 Quad: 5/5 VMO atrophy:No Hamstring concentric and eccentric: 5/5   Decreased sensation on the dorsum of the patient's foot, more pronounced distally.  Radiology: No results found.  Assessment and Plan:   Acute pain of left knee  Knee is much better, f/u prn. OK to progress from exercise standpoint.  Trial of metatarsal pads for forefoot  Follow-up: No Follow-up on file.  Signed,  [name]. Jorden Minchey, MD   Patient's Medications  New Prescriptions   No medications on file  Previous Medications   ALBUTEROL (PROVENTIL HFA;VENTOLIN HFA) 108 (90 BASE) MCG/ACT INHALER    Inhale 2 puffs into the lungs 3  (three) times daily as needed.   CALCIPOTRIENE (DOVONOX) 0.005 % CREAM    Apply topically as needed.   CLOBETASOL OINTMENT (TEMOVATE) 0.05 %    Apply 1 application topically as needed.   FEXOFENADINE (ALLEGRA) 180 MG TABLET  Take 180 mg by mouth daily as needed for allergies or rhinitis.   FLUTICASONE (FLONASE) 50 MCG/ACT NASAL SPRAY    Place 2 sprays into the nose daily.   IBUPROFEN (ADVIL,MOTRIN) 800 MG TABLET    TAKE 1 TABLET (800 MG TOTAL) BY MOUTH EVERY 6 (SIX) HOURS AS NEEDED.   OLOPATADINE (PATANOL) 0.1 % OPHTHALMIC SOLUTION    Place 1 drop into both eyes 2 (two) times daily.  Modified Medications   Modified Medication Previous Medication   TRAMADOL (ULTRAM) 50 MG TABLET traMADol (ULTRAM) 50 MG tablet      TAKE 1 TABLET BY MOUTH 3 TIMES A DAY AS NEEDED    TAKE 1 TABLET BY MOUTH 3 TIMES A DAY  Discontinued Medications   No medications on file

## 2016-12-10 NOTE — Telephone Encounter (Signed)
Dr Lorelei Pont did not give him any meds. Left refill on voice mail at pharmacy

## 2016-12-10 NOTE — Telephone Encounter (Signed)
Last filled 11-09-16 #30 Last CPE 07-06-16 Next OV today with Dr Lorelei Pont

## 2017-01-23 ENCOUNTER — Encounter: Payer: Self-pay | Admitting: Internal Medicine

## 2017-01-23 MED ORDER — OLOPATADINE HCL 0.1 % OP SOLN: 1.0000 [drp] | Freq: Two times a day (BID) | OPHTHALMIC | 5 refills | Status: DC

## 2017-01-23 NOTE — Telephone Encounter (Signed)
Rx sent electronically.  

## 2017-06-05 ENCOUNTER — Telehealth: Payer: Self-pay | Admitting: Internal Medicine

## 2017-06-05 NOTE — Telephone Encounter (Signed)
Copied from [place]. Topic: General - Other >> Jun 05, 2017  2:57 PM Lolita Rieger, RMA wrote: Reason for CRM: Pt needs all medications transferred to [facility] [place]. They were sent to [facility] without pt ok and now they will not transfer medication

## 2017-06-05 NOTE — Telephone Encounter (Signed)
I spoke with Mr Seda and he said the call is for his wife and not for him. I made note in Mrs Yeager chart.

## 2018-02-06 ENCOUNTER — Ambulatory Visit (INDEPENDENT_AMBULATORY_CARE_PROVIDER_SITE_OTHER): Payer: 59

## 2018-02-06 DIAGNOSIS — Z23 Encounter for immunization: Secondary | ICD-10-CM

## 2018-02-06 NOTE — Progress Notes (Signed)
Pt came in to get regular flu shot, Ok per Dr. Deborra Medina. Pt toleranted well.

## 2018-03-17 NOTE — Telephone Encounter (Signed)
No --that is fine Make sure he knows to be fasting and he should cut out any concentrated sweets like sugared drinks and most desserts

## 2018-03-17 NOTE — Telephone Encounter (Signed)
Let him know I am concerned about his glucose and BP. Make sure he is fasting before the appointment

## 2018-03-17 NOTE — Telephone Encounter (Signed)
Please call him to schedule PE (or at least OV) soon

## 2018-04-24 ENCOUNTER — Encounter: Payer: Self-pay | Admitting: Internal Medicine

## 2018-04-24 ENCOUNTER — Ambulatory Visit (INDEPENDENT_AMBULATORY_CARE_PROVIDER_SITE_OTHER): Payer: 59 | Admitting: Internal Medicine

## 2018-04-24 VITALS — BP 140/88 | HR 67 | Temp 98.4°F | Ht 73.5 in | Wt 238.0 lb

## 2018-04-24 DIAGNOSIS — G4733 Obstructive sleep apnea (adult) (pediatric): Secondary | ICD-10-CM

## 2018-04-24 DIAGNOSIS — Z1211 Encounter for screening for malignant neoplasm of colon: Secondary | ICD-10-CM

## 2018-04-24 DIAGNOSIS — M549 Dorsalgia, unspecified: Secondary | ICD-10-CM

## 2018-04-24 DIAGNOSIS — R7303 Prediabetes: Secondary | ICD-10-CM | POA: Diagnosis not present

## 2018-04-24 DIAGNOSIS — Z0000 Encounter for general adult medical examination without abnormal findings: Secondary | ICD-10-CM

## 2018-04-24 DIAGNOSIS — G8929 Other chronic pain: Secondary | ICD-10-CM | POA: Insufficient documentation

## 2018-04-24 MED ORDER — TRAMADOL HCL 50 MG PO TABS: 50.0000 mg | ORAL_TABLET | Freq: Three times a day (TID) | ORAL | 0 refills | Status: DC | PRN

## 2018-04-24 MED ORDER — OLOPATADINE HCL 0.1 % OP SOLN: 1.0000 [drp] | Freq: Two times a day (BID) | OPHTHALMIC | 5 refills | Status: AC

## 2018-04-24 NOTE — Assessment & Plan Note (Signed)
Needs to work on fitness Yearly flu vaccine Schedule colonoscopy No PSA till at least 26

## 2018-04-24 NOTE — Assessment & Plan Note (Signed)
Intermittent Will refill the tramadol for occasional use

## 2018-04-24 NOTE — Assessment & Plan Note (Signed)
Uses the CPAP

## 2018-04-24 NOTE — Assessment & Plan Note (Signed)
Will recheck labs Consider starting metformin

## 2018-04-24 NOTE — Patient Instructions (Signed)
DASH Eating Plan  DASH stands for "Dietary Approaches to Stop Hypertension." The DASH eating plan is a healthy eating plan that has been shown to reduce high blood pressure (hypertension). It may also reduce your risk for type 2 diabetes, heart disease, and stroke. The DASH eating plan may also help with weight loss.  What are tips for following this plan?    General guidelines   Avoid eating more than 2,300 mg (milligrams) of salt (sodium) a day. If you have hypertension, you may need to reduce your sodium intake to 1,500 mg a day.   Limit alcohol intake to no more than 1 drink a day for nonpregnant women and 2 drinks a day for men. One drink equals 12 oz of beer, 5 oz of wine, or 1 oz of hard liquor.   Work with your health care provider to maintain a healthy body weight or to lose weight. Ask what an ideal weight is for you.   Get at least 30 minutes of exercise that causes your heart to beat faster (aerobic exercise) most days of the week. Activities may include walking, swimming, or biking.   Work with your health care provider or diet and nutrition specialist (dietitian) to adjust your eating plan to your individual calorie needs.  Reading food labels     Check food labels for the amount of sodium per serving. Choose foods with less than 5 percent of the Daily Value of sodium. Generally, foods with less than 300 mg of sodium per serving fit into this eating plan.   To find whole grains, look for the word "whole" as the first word in the ingredient list.  Shopping   Buy products labeled as "low-sodium" or "no salt added."   Buy fresh foods. Avoid canned foods and premade or frozen meals.  Cooking   Avoid adding salt when cooking. Use salt-free seasonings or herbs instead of table salt or sea salt. Check with your health care provider or pharmacist before using salt substitutes.   Do not fry foods. Cook foods using healthy methods such as baking, boiling, grilling, and broiling instead.   Cook with  heart-healthy oils, such as olive, canola, soybean, or sunflower oil.  Meal planning   Eat a balanced diet that includes:  ? 5 or more servings of fruits and vegetables each day. At each meal, try to fill half of your plate with fruits and vegetables.  ? Up to 6-8 servings of whole grains each day.  ? Less than 6 oz of lean meat, poultry, or fish each day. A 3-oz serving of meat is about the same size as a deck of cards. One egg equals 1 oz.  ? 2 servings of low-fat dairy each day.  ? A serving of nuts, seeds, or beans 5 times each week.  ? Heart-healthy fats. Healthy fats called Omega-3 fatty acids are found in foods such as flaxseeds and coldwater fish, like sardines, salmon, and mackerel.   Limit how much you eat of the following:  ? Canned or prepackaged foods.  ? Food that is high in trans fat, such as fried foods.  ? Food that is high in saturated fat, such as fatty meat.  ? Sweets, desserts, sugary drinks, and other foods with added sugar.  ? Full-fat dairy products.   Do not salt foods before eating.   Try to eat at least 2 vegetarian meals each week.   Eat more home-cooked food and less restaurant, buffet, and fast food.     When eating at a restaurant, ask that your food be prepared with less salt or no salt, if possible.  What foods are recommended?  The items listed may not be a complete list. Talk with your dietitian about what dietary choices are best for you.  Grains  Whole-grain or whole-wheat bread. Whole-grain or whole-wheat pasta. Brown rice. Oatmeal. Quinoa. Bulgur. Whole-grain and low-sodium cereals. Pita bread. Low-fat, low-sodium crackers. Whole-wheat flour tortillas.  Vegetables  Fresh or frozen vegetables (raw, steamed, roasted, or grilled). Low-sodium or reduced-sodium tomato and vegetable juice. Low-sodium or reduced-sodium tomato sauce and tomato paste. Low-sodium or reduced-sodium canned vegetables.  Fruits  All fresh, dried, or frozen fruit. Canned fruit in natural juice (without  added sugar).  Meat and other protein foods  Skinless chicken or turkey. Ground chicken or turkey. Pork with fat trimmed off. Fish and seafood. Egg whites. Dried beans, peas, or lentils. Unsalted nuts, nut butters, and seeds. Unsalted canned beans. Lean cuts of beef with fat trimmed off. Low-sodium, lean deli meat.  Dairy  Low-fat (1%) or fat-free (skim) milk. Fat-free, low-fat, or reduced-fat cheeses. Nonfat, low-sodium ricotta or cottage cheese. Low-fat or nonfat yogurt. Low-fat, low-sodium cheese.  Fats and oils  Soft margarine without trans fats. Vegetable oil. Low-fat, reduced-fat, or light mayonnaise and salad dressings (reduced-sodium). Canola, safflower, olive, soybean, and sunflower oils. Avocado.  Seasoning and other foods  Herbs. Spices. Seasoning mixes without salt. Unsalted popcorn and pretzels. Fat-free sweets.  What foods are not recommended?  The items listed may not be a complete list. Talk with your dietitian about what dietary choices are best for you.  Grains  Baked goods made with fat, such as croissants, muffins, or some breads. Dry pasta or rice meal packs.  Vegetables  Creamed or fried vegetables. Vegetables in a cheese sauce. Regular canned vegetables (not low-sodium or reduced-sodium). Regular canned tomato sauce and paste (not low-sodium or reduced-sodium). Regular tomato and vegetable juice (not low-sodium or reduced-sodium). Pickles. Olives.  Fruits  Canned fruit in a light or heavy syrup. Fried fruit. Fruit in cream or butter sauce.  Meat and other protein foods  Fatty cuts of meat. Ribs. Fried meat. Bacon. Sausage. Bologna and other processed lunch meats. Salami. Fatback. Hotdogs. Bratwurst. Salted nuts and seeds. Canned beans with added salt. Canned or smoked fish. Whole eggs or egg yolks. Chicken or turkey with skin.  Dairy  Whole or 2% milk, cream, and half-and-half. Whole or full-fat cream cheese. Whole-fat or sweetened yogurt. Full-fat cheese. Nondairy creamers. Whipped toppings.  Processed cheese and cheese spreads.  Fats and oils  Butter. Stick margarine. Lard. Shortening. Ghee. Bacon fat. Tropical oils, such as coconut, palm kernel, or palm oil.  Seasoning and other foods  Salted popcorn and pretzels. Onion salt, garlic salt, seasoned salt, table salt, and sea salt. Worcestershire sauce. Tartar sauce. Barbecue sauce. Teriyaki sauce. Soy sauce, including reduced-sodium. Steak sauce. Canned and packaged gravies. Fish sauce. Oyster sauce. Cocktail sauce. Horseradish that you find on the shelf. Ketchup. Mustard. Meat flavorings and tenderizers. Bouillon cubes. Hot sauce and Tabasco sauce. Premade or packaged marinades. Premade or packaged taco seasonings. Relishes. Regular salad dressings.  Where to find more information:   National Heart, Lung, and Blood Institute: www.nhlbi.nih.gov   American Heart Association: www.heart.org  Summary   The DASH eating plan is a healthy eating plan that has been shown to reduce high blood pressure (hypertension). It may also reduce your risk for type 2 diabetes, heart disease, and stroke.   With the   DASH eating plan, you should limit salt (sodium) intake to 2,300 mg a day. If you have hypertension, you may need to reduce your sodium intake to 1,500 mg a day.   When on the DASH eating plan, aim to eat more fresh fruits and vegetables, whole grains, lean proteins, low-fat dairy, and heart-healthy fats.   Work with your health care provider or diet and nutrition specialist (dietitian) to adjust your eating plan to your individual calorie needs.  This information is not intended to replace advice given to you by your health care provider. Make sure you discuss any questions you have with your health care provider.  Document Released: 03/22/2011 Document Revised: 03/26/2016 Document Reviewed: 03/26/2016  Elsevier Interactive Patient Education  2019 [employer] Inc[employer]

## 2018-04-24 NOTE — Progress Notes (Signed)
Subjective:    Patient ID: Joseph Jackson, male    DOB: [birth year], 53 y.o.   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 albuterol (PROVENTIL HFA;VENTOLIN HFA) 108 (90 BASE) MCG/ACT inhaler Inhale 2 puffs into the lungs 3 (three) times daily as needed. 1 Inhaler 0  . calcipotriene (DOVONOX) 0.005 % cream Apply topically as needed. 60 g 1  . clobetasol ointment (TEMOVATE) 3.55 % Apply 1 application topically as needed. 30 g 1  . fexofenadine (ALLEGRA) 180 MG tablet Take 180 mg by mouth daily as needed for allergies or rhinitis.    . fluticasone (FLONASE) 50 MCG/ACT nasal spray Place 2 sprays into the nose daily.    Marland Kitchen ibuprofen (ADVIL,MOTRIN) 800 MG tablet TAKE 1 TABLET (800 MG TOTAL) BY MOUTH EVERY 6 (SIX) HOURS AS NEEDED. 100 tablet 3  . olopatadine (PATANOL) 0.1 % ophthalmic solution Place 1 drop into both eyes 2 (two) times daily. 5 mL 5  . traMADol (ULTRAM) 50 MG tablet TAKE 1 TABLET BY MOUTH 3 TIMES A DAY AS NEEDED 30 tablet 0   No current facility-administered medications on file prior to visit.     Allergies  Allergen Reactions  . Aspirin     REACTION: swelling    Past Medical History:  Diagnosis Date  . Allergy   . Anxiety   . Dermatitis due to drugs and medicines taken internally(693.0)   . GERD (gastroesophageal reflux disease)   . Nephrolithiasis 6/16   Passed kidney stone  . Pure hyperglyceridemia   . Unspecified sleep apnea     Past Surgical  History:  Procedure Laterality Date  . US ECHOCARDIOGRAPHY  10/07   normal, holter benign   . WISDOM TOOTH EXTRACTION      Family History  Problem Relation Age of Onset  . Cancer Mother   . Stroke Father   . Heart disease Father        MI due to aortic tear  . Mental illness Sister   . Diabetes Neg Hx     Social History   Socioeconomic History  . Marital status: Married    Spouse name: Not on file  . Number of children: 2  . Years of education: Not on file  . Highest education level: Not on file  Occupational History  . 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 Financial resource strain: Not on file  . Food insecurity:    Worry: Not on file    Inability: Not on file  . Transportation needs:    Medical: Not on file    Non-medical: Not on file  Tobacco Use  . Smoking status: Never Smoker  . Smokeless tobacco: Never Used  Substance and Sexual Activity  . Alcohol use: No    Alcohol/week: 0.0 standard drinks    Comment: occasional  . Drug use: No  . Sexual activity: Not on file  Lifestyle  . Physical activity:    Days per week: Not on file  Minutes per session: Not on file  . Stress: Not on file  Relationships  . Social connections:    Talks on phone: Not on file    Gets together: Not on file    Attends religious service: Not on file    Active member of club or organization: Not on file    Attends meetings of clubs or organizations: Not on file    Relationship status: Not on file  . Intimate partner violence:    Fear of current or ex partner: Not on file    Emotionally abused: Not on file    Physically abused: Not on file    Forced sexual activity: Not on file  Other Topics Concern  . Not on file  Social History Narrative  . Not on file   Review of Systems  Constitutional: Positive for unexpected weight change.  HENT: Positive for tinnitus. Negative for dental problem, hearing loss and trouble swallowing.        Overdue for dentist    Eyes: Negative for visual disturbance.       No diplopia or unilateral vision loss  Respiratory: Negative for cough, chest tightness and shortness of breath.   Cardiovascular: Positive for palpitations. Negative for chest pain and leg swelling.  Gastrointestinal: Negative for blood in stool and constipation.       Occ bouts of IBS---diarrhea Some hemorrhoidal bleeding Occasional heartburn  Endocrine: Negative for polydipsia and polyuria.  Genitourinary: Negative for difficulty urinating and frequency.       Some ED--stamina  Musculoskeletal: Positive for arthralgias and back pain. Negative for joint swelling.       Left hip pain and popping at times Regular back pain--relates to sitting in car all the time Uses tramadol about once a month  Skin:       Lipomas come and go--usually painless (vs cyst) Some patches around eyes and nose---steroid cream helps  Allergic/Immunologic: Positive for environmental allergies. Negative for immunocompromised state.  Neurological: Positive for dizziness and headaches. Negative for syncope.       HA/migraine if BP up  Hematological: Does not bruise/bleed easily.       Some glands around neck and throat--related to allergies  Psychiatric/Behavioral: Negative for dysphoric mood.       Mostly sleeps okay---occ pain or can't shut off brain Situational anxiety---with being forced to shift careers Sleeps with CPAP       Objective:   Physical Exam  Constitutional: He is oriented to person, place, and time. He appears well-developed. No distress.  HENT:  Head: Normocephalic and atraumatic.  Right Ear: External ear normal.  Left Ear: External ear normal.  Mouth/Throat: Oropharynx is clear and moist. No oropharyngeal exudate.  Eyes: Pupils are equal, round, and reactive to light. Conjunctivae are normal.  Neck: No thyromegaly present.  Cardiovascular: Normal rate, regular rhythm, normal heart sounds and intact distal pulses. Exam reveals no gallop.   No murmur heard. Respiratory: Effort normal and breath sounds normal. No respiratory distress. He has no wheezes. He has no rales.  GI: Soft. There is no abdominal tenderness.  Diastasis recti--mid abdomen  Genitourinary:    Genitourinary Comments: Nodule on shaft of penis--not wart (recommended derm if persists)   Musculoskeletal:        General: No tenderness or edema.  Lymphadenopathy:    He has no cervical adenopathy.  Neurological: He is alert and oriented to person, place, and time.  Skin: No rash noted. No erythema.  Psychiatric: He has a normal  mood and affect. His behavior is normal.           Assessment & Plan:

## 2018-04-25 ENCOUNTER — Other Ambulatory Visit: Payer: Self-pay | Admitting: Internal Medicine

## 2018-04-25 LAB — CBC
HCT: 43.4 % (ref 39.0–52.0)
Hemoglobin: 15.1 g/dL (ref 13.0–17.0)
MCHC: 34.9 g/dL (ref 30.0–36.0)
MCV: 86.1 fl (ref 78.0–100.0)
Platelets: 205 10*3/uL (ref 150.0–400.0)
RBC: 5.04 Mil/uL (ref 4.22–5.81)
RDW: 12.9 % (ref 11.5–15.5)
WBC: 8.7 10*3/uL (ref 4.0–10.5)

## 2018-04-25 LAB — COMPREHENSIVE METABOLIC PANEL
ALT: 23 U/L (ref 0–53)
AST: 16 U/L (ref 0–37)
Albumin: 4.8 g/dL (ref 3.5–5.2)
Alkaline Phosphatase: 51 U/L (ref 39–117)
BUN: 11 mg/dL (ref 6–23)
CO2: 25 mEq/L (ref 19–32)
Calcium: 9.8 mg/dL (ref 8.4–10.5)
Chloride: 105 mEq/L (ref 96–112)
Creatinine, Ser: 0.83 mg/dL (ref 0.40–1.50)
GFR: 102.72 mL/min (ref >60.00)
Glucose, Bld: 103 mg/dL — ABNORMAL HIGH (ref 70–99)
Potassium: 4.3 mEq/L (ref 3.5–5.1)
Sodium: 139 mEq/L (ref 135–145)
Total Bilirubin: 0.7 mg/dL (ref 0.2–1.2)
Total Protein: 7.3 g/dL (ref 6.0–8.3)

## 2018-04-25 LAB — T4, FREE: Free T4: 0.88 ng/dL (ref 0.60–1.60)

## 2018-04-25 LAB — HEMOGLOBIN A1C: HEMOGLOBIN A1C: 7 % — AB (ref 4.6–6.5)

## 2018-04-25 MED ORDER — METFORMIN HCL ER 500 MG PO TB24: 500.0000 mg | ORAL_TABLET | Freq: Every day | ORAL | 3 refills | Status: DC

## 2018-05-02 ENCOUNTER — Other Ambulatory Visit: Payer: Self-pay

## 2018-05-02 DIAGNOSIS — Z1211 Encounter for screening for malignant neoplasm of colon: Secondary | ICD-10-CM

## 2018-06-03 ENCOUNTER — Ambulatory Visit: Payer: 59 | Admitting: Anesthesiology

## 2018-06-03 ENCOUNTER — Encounter: Payer: Self-pay | Admitting: *Deleted

## 2018-06-03 ENCOUNTER — Encounter
Admission: RE | Disposition: A | Discharge status: Home / Self Care | Payer: Self-pay | Source: Home / Self Care | Attending: Gastroenterology

## 2018-06-03 ENCOUNTER — Ambulatory Visit
Admission: RE | Admit: 2018-06-03 | Discharge: 2018-06-03 | Disposition: A | Discharge status: Home / Self Care | Payer: 59 | Attending: Gastroenterology | Admitting: Gastroenterology

## 2018-06-03 DIAGNOSIS — Z87442 Personal history of urinary calculi: Secondary | ICD-10-CM | POA: Diagnosis not present

## 2018-06-03 DIAGNOSIS — Z79899 Other long term (current) drug therapy: Secondary | ICD-10-CM | POA: Insufficient documentation

## 2018-06-03 DIAGNOSIS — Z7951 Long term (current) use of inhaled steroids: Secondary | ICD-10-CM | POA: Insufficient documentation

## 2018-06-03 DIAGNOSIS — K640 First degree hemorrhoids: Secondary | ICD-10-CM | POA: Insufficient documentation

## 2018-06-03 DIAGNOSIS — K635 Polyp of colon: Secondary | ICD-10-CM | POA: Diagnosis not present

## 2018-06-03 DIAGNOSIS — Z1211 Encounter for screening for malignant neoplasm of colon: Secondary | ICD-10-CM

## 2018-06-03 DIAGNOSIS — J45909 Unspecified asthma, uncomplicated: Secondary | ICD-10-CM | POA: Diagnosis not present

## 2018-06-03 DIAGNOSIS — K219 Gastro-esophageal reflux disease without esophagitis: Secondary | ICD-10-CM | POA: Diagnosis not present

## 2018-06-03 DIAGNOSIS — Z823 Family history of stroke: Secondary | ICD-10-CM | POA: Insufficient documentation

## 2018-06-03 DIAGNOSIS — Z8249 Family history of ischemic heart disease and other diseases of the circulatory system: Secondary | ICD-10-CM | POA: Insufficient documentation

## 2018-06-03 DIAGNOSIS — Z791 Long term (current) use of non-steroidal anti-inflammatories (NSAID): Secondary | ICD-10-CM | POA: Diagnosis not present

## 2018-06-03 DIAGNOSIS — K5790 Diverticulosis of intestine, part unspecified, without perforation or abscess without bleeding: Secondary | ICD-10-CM | POA: Diagnosis not present

## 2018-06-03 DIAGNOSIS — F419 Anxiety disorder, unspecified: Secondary | ICD-10-CM | POA: Diagnosis not present

## 2018-06-03 DIAGNOSIS — Z7982 Long term (current) use of aspirin: Secondary | ICD-10-CM | POA: Insufficient documentation

## 2018-06-03 DIAGNOSIS — K621 Rectal polyp: Secondary | ICD-10-CM

## 2018-06-03 DIAGNOSIS — Z886 Allergy status to analgesic agent status: Secondary | ICD-10-CM | POA: Diagnosis not present

## 2018-06-03 DIAGNOSIS — D122 Benign neoplasm of ascending colon: Secondary | ICD-10-CM

## 2018-06-03 DIAGNOSIS — Z7984 Long term (current) use of oral hypoglycemic drugs: Secondary | ICD-10-CM | POA: Diagnosis not present

## 2018-06-03 DIAGNOSIS — E781 Pure hyperglyceridemia: Secondary | ICD-10-CM | POA: Insufficient documentation

## 2018-06-03 DIAGNOSIS — Z809 Family history of malignant neoplasm, unspecified: Secondary | ICD-10-CM | POA: Diagnosis not present

## 2018-06-03 DIAGNOSIS — D128 Benign neoplasm of rectum: Secondary | ICD-10-CM | POA: Diagnosis not present

## 2018-06-03 DIAGNOSIS — Z8269 Family history of other diseases of the musculoskeletal system and connective tissue: Secondary | ICD-10-CM | POA: Insufficient documentation

## 2018-06-03 HISTORY — PX: COLONOSCOPY WITH PROPOFOL: SHX5780

## 2018-06-03 HISTORY — DX: Unspecified asthma, uncomplicated: J45.909

## 2018-06-03 SURGERY — COLONOSCOPY WITH PROPOFOL
Anesthesia: General

## 2018-06-03 MED ORDER — LIDOCAINE HCL (CARDIAC) PF 100 MG/5ML IV SOSY
PREFILLED_SYRINGE | INTRAVENOUS | Status: DC | PRN
  Administered 2018-06-03: 100 mg via INTRAVENOUS

## 2018-06-03 MED ORDER — PROPOFOL 10 MG/ML IV BOLUS
INTRAVENOUS | Status: AC
  Filled 2018-06-03: qty 20

## 2018-06-03 MED ORDER — PROPOFOL 500 MG/50ML IV EMUL
INTRAVENOUS | Status: DC | PRN
  Administered 2018-06-03: 125 ug/kg/min via INTRAVENOUS

## 2018-06-03 MED ORDER — PROPOFOL 10 MG/ML IV BOLUS
INTRAVENOUS | Status: DC | PRN
  Administered 2018-06-03: 80 mg via INTRAVENOUS
  Administered 2018-06-03: 20 mg via INTRAVENOUS

## 2018-06-03 MED ORDER — SODIUM CHLORIDE 0.9 % IV SOLN
INTRAVENOUS | Status: DC
  Administered 2018-06-03: 1000 mL via INTRAVENOUS

## 2018-06-03 MED ORDER — PROPOFOL 500 MG/50ML IV EMUL
INTRAVENOUS | Status: AC
  Filled 2018-06-03: qty 50

## 2018-06-03 NOTE — H&P (Signed)
Jonathon Bellows, MD [address], [place], [place], [place], [address] [address], [place], [place], [place], [address] Phone: [phone]  Fax: [phone]  Primary Care Physician:  Venia Carbon, MD   Pre-Procedure History & Physical: HPI:  Joseph Jackson is a 53 y.o. male is here for an colonoscopy.   Past Medical History:  Diagnosis Date  . Allergy   . Anxiety   . Asthma   . Dermatitis due to drugs and medicines taken internally(693.0)   . GERD (gastroesophageal reflux disease)   . Nephrolithiasis 6/16   Passed kidney stone  . Pure hyperglyceridemia   . Unspecified sleep apnea     Past Surgical History:  Procedure Laterality Date  . US ECHOCARDIOGRAPHY  10/07   normal, holter benign   . WISDOM TOOTH EXTRACTION      Prior to Admission medications   Medication Sig Start Date End Date Taking? Authorizing Provider  albuterol (PROVENTIL HFA;VENTOLIN HFA) 108 (90 BASE) MCG/ACT inhaler Inhale 2 puffs into the lungs 3 (three) times daily as needed. 04/20/14   Jearld Fenton, NP  calcipotriene (DOVONOX) 0.005 % cream Apply topically as needed. 06/01/15   Venia Carbon, MD  clobetasol ointment (TEMOVATE) 9.03 % Apply 1 application topically as needed. 06/01/15   Venia Carbon, MD  fexofenadine (ALLEGRA) 180 MG tablet Take 180 mg by mouth daily as needed for allergies or rhinitis.    [provider]  fluticasone (FLONASE) 50 MCG/ACT nasal spray Place 2 sprays into the nose daily.    Venia Carbon, MD  ibuprofen (ADVIL,MOTRIN) 800 MG tablet TAKE 1 TABLET (800 MG TOTAL) BY MOUTH EVERY 6 (SIX) HOURS AS NEEDED. 01/16/16   Venia Carbon, MD  metFORMIN (GLUCOPHAGE-XR) 500 MG 24 hr tablet Take 1 tablet (500 mg total) by mouth daily with breakfast. 04/25/18   Venia Carbon, MD  olopatadine (PATANOL) 0.1 % ophthalmic solution Place 1 drop into both eyes 2 (two) times daily. 04/24/18   Venia Carbon, MD  traMADol (ULTRAM) 50 MG tablet Take 1 tablet  (50 mg total) by mouth 3 (three) times daily as needed. 04/24/18   Venia Carbon, MD    Allergies as of 05/02/2018 - Review Complete 04/24/2018  Allergen Reaction Noted  . Aspirin  11/13/2006    Family History  Problem Relation Age of Onset  . Cancer Mother   . Stroke Father   . Heart disease Father        MI due to aortic tear  . Spondylolysis Father   . Mental illness Sister   . Diabetes Neg Hx     Social History   Socioeconomic History  . Marital status: Married    Spouse name: Not on file  . Number of children: 2  . Years of education: Not on file  . Highest education level: Not on file  Occupational History  . 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 Financial resource strain: Not on file  . Food insecurity:    Worry: Not on file    Inability: Not on file  . Transportation needs:    Medical: Not on file    Non-medical: Not on file  Tobacco Use  . Smoking status: Never Smoker  . Smokeless tobacco: Never Used  Substance and Sexual Activity  . Alcohol use: No    Alcohol/week: 0.0 standard drinks    Comment: occasional  . Drug use: No  . Sexual activity: Not on  file  Lifestyle  . Physical activity:    Days per week: Not on file    Minutes per session: Not on file  . Stress: Not on file  Relationships  . Social connections:    Talks on phone: Not on file    Gets together: Not on file    Attends religious service: Not on file    Active member of club or organization: Not on file    Attends meetings of clubs or organizations: Not on file    Relationship status: Not on file  . Intimate partner violence:    Fear of current or ex partner: Not on file    Emotionally abused: Not on file    Physically abused: Not on file    Forced sexual activity: Not on file  Other Topics Concern  . Not on file  Social History Narrative  . Not on file    Review of Systems: See HPI, otherwise negative ROS  Physical Exam: BP (!) 138/100   Pulse 75    Temp (!) 96.7 F (35.9 C) (Tympanic)   Resp 14   Ht '6\' 2"'  (9.93 m)   Wt 108 kg   SpO2 98%   BMI 30.56 kg/m  General:   Alert,  pleasant and cooperative in NAD Head:  Normocephalic and atraumatic. Neck:  Supple; no masses or thyromegaly. Lungs:  Clear throughout to auscultation, normal respiratory effort.    Heart:  +S1, +S2, Regular rate and rhythm, No edema. Abdomen:  Soft, nontender and nondistended. Normal bowel sounds, without guarding, and without rebound.   Neurologic:  Alert and  oriented x4;  grossly normal neurologically.  Impression/Plan: Joseph Jackson is here for an colonoscopy to be performed for Screening colonoscopy average risk   Risks, benefits, limitations, and alternatives regarding  colonoscopy have been reviewed with the patient.  Questions have been answered.  All parties agreeable.   Jonathon Bellows, MD  06/03/2018, 8:34 AM

## 2018-06-03 NOTE — Anesthesia Post-op Follow-up Note (Signed)
Anesthesia QCDR form completed.        

## 2018-06-03 NOTE — Transfer of Care (Signed)
Immediate Anesthesia Transfer of Care Note  Patient: Joseph Jackson  Procedure(s) Performed: COLONOSCOPY WITH PROPOFOL (N/A )  Patient Location: PACU  Anesthesia Type:General  Level of Consciousness: sedated  Airway & Oxygen Therapy: Patient Spontanous Breathing and Patient connected to nasal cannula oxygen  Post-op Assessment: Report given to RN and Post -op Vital signs reviewed and stable  Post vital signs: Reviewed and stable  Last Vitals:  Vitals Value Taken Time  BP    Temp 36.1 C 06/03/2018  9:10 AM  Pulse 78 06/03/2018  9:15 AM  Resp 19 06/03/2018  9:15 AM  SpO2 94 % 06/03/2018  9:15 AM  Vitals shown include unvalidated device data.  Last Pain:  Vitals:   06/03/18 0910  TempSrc: Tympanic  PainSc:          Complications: No apparent anesthesia complications

## 2018-06-03 NOTE — Op Note (Signed)
[facility] Patient Name: Joseph Jackson Procedure Date: 06/03/2018 8:35 AM MRN: [id number] Account #: [id number] Date of Birth: 1966/03/03 Admit Type: Outpatient Age: 53 Room: [facility] ENDO ROOM 1 Gender: Male Note Status: Finalized Procedure:            Colonoscopy Indications:          Screening for colorectal malignant neoplasm Providers:            Jonathon Bellows MD, MD Medicines:            Monitored Anesthesia Care Complications:        No immediate complications. Procedure:            Pre-Anesthesia Assessment:                       - Prior to the procedure, a History and Physical was                        performed, and patient medications, allergies and                        sensitivities were reviewed. The patient's tolerance of                        previous anesthesia was reviewed.                       - The risks and benefits of the procedure and the                        sedation options and risks were discussed with the                        patient. All questions were answered and informed                        consent was obtained.                       - ASA Grade Assessment: II - A patient with mild                        systemic disease.                       After obtaining informed consent, the colonoscope was                        passed under direct vision. Throughout the procedure,                        the patient's blood pressure, pulse, and oxygen                        saturations were monitored continuously. The                        Colonoscope was introduced through the anus and                        advanced to the the cecum, identified by appendiceal  orifice and ileocecal valve. The colonoscopy was                        performed with ease. The patient tolerated the                        procedure well. The quality of the bowel preparation                        was  good. Findings:      The perianal and digital rectal examinations were normal.      A 8 mm polyp was found in the rectum. The polyp was pedunculated. The       polyp was removed with a hot snare. Resection and retrieval were       complete.      Two sessile polyps were found in the rectum. The polyps were 4 to 7 mm       in size. These polyps were removed with a cold snare. Resection and       retrieval were complete.      A 6 mm polyp was found in the ascending colon. The polyp was sessile.       The polyp was removed with a cold snare. Resection and retrieval were       complete.      Non-bleeding internal hemorrhoids were found during endoscopy. The       hemorrhoids were medium-sized and Grade I (internal hemorrhoids that do       not prolapse).      The exam was otherwise without abnormality on direct and retroflexion       views. Impression:           - One 8 mm polyp in the rectum, removed with a hot                        snare. Resected and retrieved.                       - Two 4 to 7 mm polyps in the rectum, removed with a                        cold snare. Resected and retrieved.                       - One 6 mm polyp in the ascending colon, removed with a                        cold snare. Resected and retrieved.                       - Non-bleeding internal hemorrhoids.                       - The examination was otherwise normal on direct and                        retroflexion views. Recommendation:       - Discharge patient to home (with escort).                       - Resume previous diet.                       -  Continue present medications.                       - Await pathology results.                       - Repeat colonoscopy in 3 - 5 years for surveillance                        based on pathology results. Procedure Code(s):    --- Professional ---                       [phone], Colonoscopy, flexible; with removal of tumor(s),                         polyp(s), or other lesion(s) by snare technique Diagnosis Code(s):    --- Professional ---                       Z12.11, Encounter for screening for malignant neoplasm                        of colon                       K62.1, Rectal polyp                       D12.2, Benign neoplasm of ascending colon                       K64.0, First degree hemorrhoids CPT copyright 2018 American Medical Association. All rights reserved. The codes documented in this report are preliminary and upon coder review may  be revised to meet current compliance requirements. Jonathon Bellows, MD Jonathon Bellows MD, MD 06/03/2018 9:10:48 AM This report has been signed electronically. Number of Addenda: 0 Note Initiated On: 06/03/2018 8:35 AM Scope Withdrawal Time: 0 hours 17 minutes 52 seconds  Total Procedure Duration: 0 hours 21 minutes 21 seconds       [facility]

## 2018-06-03 NOTE — Anesthesia Preprocedure Evaluation (Addendum)
Anesthesia Evaluation  Patient identified by MRN, date of birth, ID band Patient awake    Reviewed: Allergy & Precautions, H&P , NPO status , Patient's Chart, lab work & pertinent test results  Airway Mallampati: III       Dental  (+) Teeth Intact   Pulmonary sleep apnea ,           Cardiovascular negative cardio ROS       Neuro/Psych PSYCHIATRIC DISORDERS Anxiety negative neurological ROS     GI/Hepatic Neg liver ROS, GERD  Controlled,  Endo/Other  negative endocrine ROS  Renal/GU Renal disease (stones)  negative genitourinary   Musculoskeletal   Abdominal   Peds  Hematology negative hematology ROS (+)   Anesthesia Other Findings Past Medical History: No date: Allergy No date: Anxiety No date: Dermatitis due to drugs and medicines taken internally(693.0) No date: GERD (gastroesophageal reflux disease) 6/16: Nephrolithiasis     Comment:  Passed kidney stone No date: Pure hyperglyceridemia No date: Unspecified sleep apnea  Past Surgical History: 10/07: US ECHOCARDIOGRAPHY     Comment:  normal, holter benign  No date: WISDOM TOOTH EXTRACTION     Reproductive/Obstetrics negative OB ROS                            Anesthesia Physical Anesthesia Plan  ASA: II  Anesthesia Plan: General   Post-op Pain Management:    Induction:   PONV Risk Score and Plan: Propofol infusion and TIVA  Airway Management Planned: Natural Airway  Additional Equipment:   Intra-op Plan:   Post-operative Plan:   Informed Consent: I have reviewed the patients History and Physical, chart, labs and discussed the procedure including the risks, benefits and alternatives for the proposed anesthesia with the patient or authorized representative who has indicated his/her understanding and acceptance.     Dental Advisory Given  Plan Discussed with: Anesthesiologist and CRNA  Anesthesia Plan  Comments:         Anesthesia Quick Evaluation

## 2018-06-04 ENCOUNTER — Encounter: Payer: Self-pay | Admitting: Gastroenterology

## 2018-06-04 LAB — SURGICAL PATHOLOGY

## 2018-06-04 NOTE — Anesthesia Postprocedure Evaluation (Signed)
Anesthesia Post Note  Patient: Joseph Jackson  Procedure(s) Performed: COLONOSCOPY WITH PROPOFOL (N/A )  Patient location during evaluation: PACU Anesthesia Type: General Level of consciousness: awake and alert Pain management: pain level controlled Vital Signs Assessment: post-procedure vital signs reviewed and stable Respiratory status: spontaneous breathing, nonlabored ventilation, respiratory function stable and patient connected to nasal cannula oxygen Cardiovascular status: blood pressure returned to baseline and stable Postop Assessment: no apparent nausea or vomiting Anesthetic complications: no     Last Vitals:  Vitals:   06/03/18 0930 06/03/18 0940  BP: 121/79 (!) 140/98  Pulse: (!) 55 (!) 57  Resp: 16 14  Temp:    SpO2: 95% 98%    Last Pain:  Vitals:   06/04/18 0745  TempSrc:   PainSc: 0-No pain                 Durenda Hurt

## 2018-06-08 ENCOUNTER — Encounter: Payer: Self-pay | Admitting: Gastroenterology

## 2018-11-05 ENCOUNTER — Telehealth: Payer: 59 | Admitting: Family

## 2018-11-05 ENCOUNTER — Encounter (INDEPENDENT_AMBULATORY_CARE_PROVIDER_SITE_OTHER): Payer: Self-pay

## 2018-11-05 DIAGNOSIS — R059 Cough, unspecified: Secondary | ICD-10-CM

## 2018-11-05 DIAGNOSIS — Z20822 Contact with and (suspected) exposure to covid-19: Secondary | ICD-10-CM

## 2018-11-05 DIAGNOSIS — R05 Cough: Secondary | ICD-10-CM

## 2018-11-05 MED ORDER — BENZONATATE 100 MG PO CAPS: 100.0000 mg | ORAL_CAPSULE | Freq: Three times a day (TID) | ORAL | 0 refills | Status: DC | PRN

## 2018-11-05 NOTE — Progress Notes (Signed)
E-Visit for Corona Virus Screening   Your current symptoms could be consistent with the coronavirus.  Many health care providers can now test patients at their office but not all are.  [place] has multiple testing sites. For information on our COVID testing locations and hours go to [email or web address]  Please quarantine yourself while awaiting your test results.  We are enrolling you in our [place] for [place] . Daily you will receive a questionnaire within the [facility] website. Our COVID 19 response team willl be monitoriing your responses daily.   The testing site is only opened 8-3:30 pm.   Approximately 5 minutes was spent documenting and reviewing patient's chart.    COVID-19 is a respiratory illness with symptoms that are similar to the flu. Symptoms are typically mild to moderate, but there have been cases of severe illness and death due to the virus. The following symptoms may appear 2-14 days after exposure: . Fever . Cough . Shortness of breath or difficulty breathing . Chills . Repeated shaking with chills . Muscle pain . Headache . Sore throat . New loss of taste or smell . Fatigue . Congestion or runny nose . Nausea or vomiting . Diarrhea  It is vitally important that if you feel that you have an infection such as this virus or any other virus that you stay home and away from places where you may spread it to others.  You should self-quarantine for 14 days if you have symptoms that could potentially be coronavirus or have been in close contact a with a person diagnosed with COVID-19 within the last 2 weeks. You should avoid contact with people age 54 and older.   You should wear a mask or cloth face covering over your nose and mouth if you must be around other people or animals, including pets (even at home). Try to stay at least 6 feet away from other people. This will protect the people around you.  You can use medication  such as A prescription cough medication called Tessalon Perles 100 mg. You may take 1-2 capsules every 8 hours as needed for cough  You may also take acetaminophen (Tylenol) as needed for fever.   Reduce your risk of any infection by using the same precautions used for avoiding the common cold or flu:  Marland Kitchen Wash your hands often with soap and warm water for at least 20 seconds.  If soap and water are not readily available, use an alcohol-based hand sanitizer with at least 60% alcohol.  . If coughing or sneezing, cover your mouth and nose by coughing or sneezing into the elbow areas of your shirt or coat, into a tissue or into your sleeve (not your hands). . Avoid shaking hands with others and consider head nods or verbal greetings only. . Avoid touching your eyes, nose, or mouth with unwashed hands.  . Avoid close contact with people who are sick. . Avoid places or events with large numbers of people in one location, like concerts or sporting events. . Carefully consider travel plans you have or are making. . If you are planning any travel outside or inside the [place], visit the CDC's Travelers' Health webpage for the latest health notices. . If you have some symptoms but not all symptoms, continue to monitor at home and seek medical attention if your symptoms worsen. . If you are having a medical emergency, call 911.  HOME CARE . Only take medications as instructed by your medical team. . Drink  plenty of fluids and get plenty of rest. . A steam or ultrasonic humidifier can help if you have congestion.   GET HELP RIGHT AWAY IF YOU HAVE EMERGENCY WARNING SIGNS** FOR COVID-19. If you or someone is showing any of these signs seek emergency medical care immediately. Call 911 or proceed to your closest emergency facility if: . You develop worsening high fever. . Trouble breathing . Bluish lips or face . Persistent pain or pressure in the chest . New confusion . Inability to wake or stay awake . You  cough up blood. . Your symptoms become more severe  **This list is not all possible symptoms. Contact your medical provider for any symptoms that are sever or concerning to you.   MAKE SURE YOU   Understand these instructions.  Will watch your condition.  Will get help right away if you are not doing well or get worse.  Your e-visit answers were reviewed by a board certified advanced clinical practitioner to complete your personal care plan.  Depending on the condition, your plan could have included both over the counter or prescription medications.  If there is a problem please reply once you have received a response from your provider.  Your safety is important to [place].  If you have drug allergies check your prescription carefully.    You can use MyChart to ask questions about today's visit, request a non-urgent call back, or ask for a work or school excuse for 24 hours related to this e-Visit. If it has been greater than 24 hours you will need to follow up with your provider, or enter a new e-Visit to address those concerns. You will get an e-mail in the next two days asking about your experience.  I hope that your e-visit has been valuable and will speed your recovery. Thank you for using e-visits.

## 2018-11-06 ENCOUNTER — Other Ambulatory Visit: Payer: Self-pay | Admitting: Internal Medicine

## 2018-11-06 ENCOUNTER — Encounter: Payer: Self-pay | Admitting: Internal Medicine

## 2018-11-06 ENCOUNTER — Encounter (INDEPENDENT_AMBULATORY_CARE_PROVIDER_SITE_OTHER): Payer: Self-pay

## 2018-11-06 DIAGNOSIS — Z20822 Contact with and (suspected) exposure to covid-19: Secondary | ICD-10-CM

## 2018-11-06 NOTE — Telephone Encounter (Signed)
Please get them letters to state they need to be quarantined till 3 days after their symptoms are gone, or upon receipt of negative SARS result

## 2018-11-06 NOTE — Telephone Encounter (Signed)
Please direct him to the closest testing spot

## 2018-11-07 ENCOUNTER — Encounter (INDEPENDENT_AMBULATORY_CARE_PROVIDER_SITE_OTHER): Payer: Self-pay

## 2018-11-08 LAB — NOVEL CORONAVIRUS, NAA: SARS-CoV-2, NAA: NOT DETECTED

## 2018-11-09 ENCOUNTER — Encounter (INDEPENDENT_AMBULATORY_CARE_PROVIDER_SITE_OTHER): Payer: Self-pay

## 2018-11-10 ENCOUNTER — Telehealth: Payer: Self-pay

## 2018-11-10 NOTE — Telephone Encounter (Signed)
Dr Einar Pheasant reviewed COVID test result on paper copy (sent for scanning) and patient advised results were negative.

## 2018-11-16 ENCOUNTER — Encounter (INDEPENDENT_AMBULATORY_CARE_PROVIDER_SITE_OTHER): Payer: Self-pay

## 2018-11-19 ENCOUNTER — Encounter (INDEPENDENT_AMBULATORY_CARE_PROVIDER_SITE_OTHER): Payer: Self-pay

## 2018-11-20 ENCOUNTER — Other Ambulatory Visit: Payer: Self-pay

## 2018-11-20 MED ORDER — TRAMADOL HCL 50 MG PO TABS: 50.0000 mg | ORAL_TABLET | Freq: Three times a day (TID) | ORAL | 0 refills | Status: DC | PRN

## 2018-11-20 NOTE — Telephone Encounter (Signed)
Name of Medication: tramadol 50 mg Name of Pharmacy:[facility] [address]  Last [place] or Written Date and Quantity:# 30 on 04/24/18  Last Office Visit and Type: 04/24/18 annual Next Office Visit and Type: 05/17/19 CPX  I spoke with pt and he still has 5 pills left but wanted refill of tramadol for occasional use with flair up of chronic back pain.

## 2019-02-09 ENCOUNTER — Other Ambulatory Visit: Payer: Self-pay

## 2019-02-09 DIAGNOSIS — Z20822 Contact with and (suspected) exposure to covid-19: Secondary | ICD-10-CM

## 2019-02-10 LAB — NOVEL CORONAVIRUS, NAA: SARS-CoV-2, NAA: NOT DETECTED

## 2019-04-29 ENCOUNTER — Other Ambulatory Visit: Payer: Self-pay

## 2019-04-29 ENCOUNTER — Encounter: Payer: 59 | Admitting: Internal Medicine

## 2019-04-29 ENCOUNTER — Ambulatory Visit (INDEPENDENT_AMBULATORY_CARE_PROVIDER_SITE_OTHER): Payer: 59 | Admitting: Internal Medicine

## 2019-04-29 ENCOUNTER — Encounter: Payer: Self-pay | Admitting: Internal Medicine

## 2019-04-29 VITALS — BP 136/86 | HR 63 | Temp 98.3°F | Ht 73.5 in | Wt 237.0 lb

## 2019-04-29 DIAGNOSIS — Z0000 Encounter for general adult medical examination without abnormal findings: Secondary | ICD-10-CM

## 2019-04-29 DIAGNOSIS — R200 Anesthesia of skin: Secondary | ICD-10-CM

## 2019-04-29 DIAGNOSIS — K219 Gastro-esophageal reflux disease without esophagitis: Secondary | ICD-10-CM | POA: Diagnosis not present

## 2019-04-29 DIAGNOSIS — R7303 Prediabetes: Secondary | ICD-10-CM | POA: Diagnosis not present

## 2019-04-29 LAB — T4, FREE: Free T4: 0.9 ng/dL (ref 0.60–1.60)

## 2019-04-29 LAB — COMPREHENSIVE METABOLIC PANEL
ALT: 23 U/L (ref 0–53)
AST: 15 U/L (ref 0–37)
Albumin: 4.6 g/dL (ref 3.5–5.2)
Alkaline Phosphatase: 55 U/L (ref 39–117)
BUN: 13 mg/dL (ref 6–23)
CO2: 27 mEq/L (ref 19–32)
Calcium: 9.8 mg/dL (ref 8.4–10.5)
Chloride: 104 mEq/L (ref 96–112)
Creatinine, Ser: 0.82 mg/dL (ref 0.40–1.50)
GFR: 97.64 mL/min (ref >60.00)
Glucose, Bld: 138 mg/dL — ABNORMAL HIGH (ref 70–99)
Potassium: 4.2 mEq/L (ref 3.5–5.1)
Sodium: 139 mEq/L (ref 135–145)
Total Bilirubin: 0.6 mg/dL (ref 0.2–1.2)
Total Protein: 6.8 g/dL (ref 6.0–8.3)

## 2019-04-29 LAB — CBC
HCT: 43.4 % (ref 39.0–52.0)
Hemoglobin: 15 g/dL (ref 13.0–17.0)
MCHC: 34.5 g/dL (ref 30.0–36.0)
MCV: 86 fl (ref 78.0–100.0)
Platelets: 194 10*3/uL (ref 150.0–400.0)
RBC: 5.05 Mil/uL (ref 4.22–5.81)
RDW: 12.8 % (ref 11.5–15.5)
WBC: 7.6 10*3/uL (ref 4.0–10.5)

## 2019-04-29 LAB — LIPID PANEL
Cholesterol: 201 mg/dL — ABNORMAL HIGH (ref 0–200)
HDL: 34.1 mg/dL — ABNORMAL LOW (ref >39.00)
LDL Cholesterol: 128 mg/dL — ABNORMAL HIGH (ref 0–99)
NonHDL: 166.49
Total CHOL/HDL Ratio: 6
Triglycerides: 194 mg/dL — ABNORMAL HIGH (ref 0.0–149.0)
VLDL: 38.8 mg/dL (ref 0.0–40.0)

## 2019-04-29 LAB — VITAMIN B12: Vitamin B-12: 347 pg/mL (ref 211–911)

## 2019-04-29 LAB — HEMOGLOBIN A1C: Hgb A1c MFr Bld: 7.2 % — ABNORMAL HIGH (ref 4.6–6.5)

## 2019-04-29 MED ORDER — IBUPROFEN 800 MG PO TABS: 800.0000 mg | ORAL_TABLET | Freq: Four times a day (QID) | ORAL | 3 refills | Status: DC | PRN

## 2019-04-29 MED ORDER — METFORMIN HCL ER 500 MG PO TB24: 500.0000 mg | ORAL_TABLET | Freq: Every day | ORAL | 3 refills | Status: DC

## 2019-04-29 MED ORDER — TRAMADOL HCL 50 MG PO TABS: 50.0000 mg | ORAL_TABLET | Freq: Three times a day (TID) | ORAL | 0 refills | Status: DC | PRN

## 2019-04-29 MED ORDER — PANTOPRAZOLE SODIUM 40 MG PO TBEC: 40.0000 mg | DELAYED_RELEASE_TABLET | Freq: Every day | ORAL | 3 refills | Status: DC

## 2019-04-29 NOTE — Assessment & Plan Note (Signed)
Hadn't started the metformin--urged him to try it now

## 2019-04-29 NOTE — Assessment & Plan Note (Signed)
Worsening ?from the sugars? Will recheck labs and set up with neurology

## 2019-04-29 NOTE — Progress Notes (Signed)
Subjective:    Patient ID: Joseph Jackson, male    DOB: [birth year], 54 y.o.   MRN: [id number]  HPI Here for physical  This visit occurred during the SARS-CoV-2 public health emergency.  Safety protocols were in place, including screening questions prior to the visit, additional usage of staff PPE, and extensive cleaning of exam room while observing appropriate contact time as indicated for disinfecting solutions.   He had more labs through work Better than last time---sugar down to 128 Triglycerides are down He has removed sugar from diet Not really exercising in the past year---[place] [employer] [place] Hasn't been taking the metformin  Intermittent back pain--better overall Uses the tramadol once in a while Often extended times in car  Ongoing sensory loss in both feet Actually had ingrown toenail and didn't know it! (did drain and heal up)  COVID test negative--but thinks he had it Bad cough and sense of smell is still not normal  Current Outpatient Medications on File Prior to Visit  Medication Sig Dispense Refill  . albuterol (PROVENTIL HFA;VENTOLIN HFA) 108 (90 BASE) MCG/ACT inhaler Inhale 2 puffs into the lungs 3 (three) times daily as needed. 1 Inhaler 0  . calcipotriene (DOVONOX) 0.005 % cream Apply topically as needed. 60 g 1  . clobetasol ointment (TEMOVATE) 2.63 % Apply 1 application topically as needed. 30 g 1  . fluticasone (FLONASE) 50 MCG/ACT nasal spray Place 2 sprays into the nose daily.    Marland Kitchen ibuprofen (ADVIL,MOTRIN) 800 MG tablet TAKE 1 TABLET (800 MG TOTAL) BY MOUTH EVERY 6 (SIX) HOURS AS NEEDED. 100 tablet 3  . olopatadine (PATANOL) 0.1 % ophthalmic solution Place 1 drop into both eyes 2 (two) times daily. 5 mL 5  . traMADol (ULTRAM) 50 MG tablet Take 1 tablet (50 mg total) by mouth 3 (three) times daily as needed. 30 tablet 0  . metFORMIN (GLUCOPHAGE-XR) 500 MG 24 hr tablet Take 1 tablet (500 mg total) by mouth daily with breakfast. (Patient not  taking: Reported on 04/29/2019) 90 tablet 3   No current facility-administered medications on file prior to visit.    Allergies  Allergen Reactions  . Aspirin     REACTION: swelling    Past Medical History:  Diagnosis Date  . Allergy   . Anxiety   . Asthma   . Dermatitis due to drugs and medicines taken internally(693.0)   . GERD (gastroesophageal reflux disease)   . Nephrolithiasis 6/16   Passed kidney stone  . Pure hyperglyceridemia   . Unspecified sleep apnea     Past Surgical History:  Procedure Laterality Date  . COLONOSCOPY WITH PROPOFOL N/A 06/03/2018   Procedure: COLONOSCOPY WITH PROPOFOL;  Surgeon: Jonathon Bellows, MD;  Location: [facility] ENDOSCOPY;  Service: Gastroenterology;  Laterality: N/A;  . US ECHOCARDIOGRAPHY  10/07   normal, holter benign   . WISDOM TOOTH EXTRACTION      Family History  Problem Relation Age of Onset  . Cancer Mother   . Stroke Father   . Heart disease Father        MI due to aortic tear  . Spondylolysis Father   . Mental illness Sister   . Diabetes Neg Hx     Social History   Socioeconomic History  . Marital status: Married    Spouse name: Not on file  . Number of children: 2  . Years of education: Not on file  . Highest education level: Not on file  Occupational History  .  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 Smoking status: Never Smoker  . Smokeless tobacco: Never Used  Substance and Sexual Activity  . Alcohol use: No    Alcohol/week: 0.0 standard drinks    Comment: occasional  . Drug use: No  . Sexual activity: Not on file  Other Topics Concern  . Not on file  Social History Narrative  . Not on file   Social Determinants of Health   Financial Resource Strain:   . Difficulty of Paying Living Expenses: Not on file  Food Insecurity:   . Worried About Charity fundraiser in the Last Year: Not on file  . Ran Out of Food in the Last Year: Not on file  Transportation Needs:   . Lack of Transportation  (Medical): Not on file  . Lack of Transportation (Non-Medical): Not on file  Physical Activity:   . Days of Exercise per Week: Not on file  . Minutes of Exercise per Session: Not on file  Stress:   . Feeling of Stress : Not on file  Social Connections:   . Frequency of Communication with Friends and Family: Not on file  . Frequency of Social Gatherings with Friends and Family: Not on file  . Attends Religious Services: Not on file  . Active Member of Clubs or Organizations: Not on file  . Attends Archivist Meetings: Not on file  . Marital Status: Not on file  Intimate Partner Violence:   . Fear of Current or Ex-Partner: Not on file  . Emotionally Abused: Not on file  . Physically Abused: Not on file  . Sexually Abused: Not on file   Review of Systems  Constitutional: Negative for fatigue and unexpected weight change.       Wears seat belt  HENT: Positive for tinnitus and trouble swallowing. Negative for dental problem.        Mild hearing loss--not ready for aides Keeps up with dentist  Eyes: Negative for visual disturbance.       No diplopia or unilateral vision loss  Respiratory: Negative for cough, chest tightness and shortness of breath.   Cardiovascular: Negative for chest pain and leg swelling.       Will get skipped beats if regular NSAIDs  Gastrointestinal: Negative for blood in stool.       Still gets some indigestion--no PPI in some time Chronic IBS---frequent stools  Endocrine: Negative for polydipsia and polyuria.  Genitourinary: Positive for frequency. Negative for difficulty urinating and urgency.       Has decreased libido and ED--considering meds  Musculoskeletal: Positive for back pain. Negative for arthralgias and joint swelling.  Skin:       Same dry patches around eyes and nose--uses clobetasol  Allergic/Immunologic: Positive for environmental allergies. Negative for immunocompromised state.       Flonase does help patanol for eyes    Neurological: Negative for dizziness, syncope, light-headedness and headaches.  Hematological: Negative for adenopathy. Does not bruise/bleed easily.       Seems to have cervical nodes in allergy season  Psychiatric/Behavioral: Negative for dysphoric mood. The patient is not nervous/anxious.        Sleeps better with CPAP----uses every night       Objective:   Physical Exam  Constitutional: He appears well-developed. No distress.  HENT:  Head: Normocephalic and atraumatic.  Right Ear: External ear normal.  Left Ear: External ear normal.  Mouth/Throat: Oropharynx is clear and moist. No oropharyngeal exudate.  Eyes: Pupils  are equal, round, and reactive to light. Conjunctivae are normal.  Neck: No thyromegaly present.  Cardiovascular: Normal rate, regular rhythm, normal heart sounds and intact distal pulses. Exam reveals no gallop.  No murmur heard. Respiratory: Effort normal and breath sounds normal. No respiratory distress. He has no wheezes. He has no rales.  GI: Soft. There is no abdominal tenderness.  Musculoskeletal:        General: No tenderness or edema.  Skin: No erythema.  Psychiatric: He has a normal mood and affect. His behavior is normal.           Assessment & Plan:

## 2019-04-29 NOTE — Assessment & Plan Note (Signed)
Healthy but out of shape No PSA till 64 Colon due in 6 years Yearly flu vaccine---had already COVID vaccine when available

## 2019-04-29 NOTE — Assessment & Plan Note (Signed)
Occ dysphagia Will restart the PPI

## 2019-05-01 LAB — PROTEIN ELECTROPHORESIS, SERUM, WITH REFLEX
Albumin ELP: 4.4 g/dL (ref 3.8–4.8)
Alpha 1: 0.3 g/dL (ref 0.2–0.3)
Alpha 2: 0.6 g/dL (ref 0.5–0.9)
Beta 2: 0.4 g/dL (ref 0.2–0.5)
Beta Globulin: 0.4 g/dL (ref 0.4–0.6)
Gamma Globulin: 0.8 g/dL (ref 0.8–1.7)
Total Protein: 6.8 g/dL (ref 6.1–8.1)

## 2019-08-20 MED ORDER — TRIAMTERENE-HCTZ 37.5-25 MG PO TABS: 1.0000 | ORAL_TABLET | Freq: Every day | ORAL | 3 refills | Status: DC

## 2019-10-27 ENCOUNTER — Other Ambulatory Visit: Payer: Self-pay

## 2019-10-28 ENCOUNTER — Other Ambulatory Visit: Payer: Self-pay

## 2019-10-28 ENCOUNTER — Ambulatory Visit (INDEPENDENT_AMBULATORY_CARE_PROVIDER_SITE_OTHER): Payer: 59 | Admitting: Internal Medicine

## 2019-10-28 ENCOUNTER — Encounter: Payer: Self-pay | Admitting: Internal Medicine

## 2019-10-28 VITALS — BP 120/90 | HR 75 | Temp 97.2°F | Ht 74.0 in | Wt 240.0 lb

## 2019-10-28 DIAGNOSIS — I10 Essential (primary) hypertension: Secondary | ICD-10-CM

## 2019-10-28 DIAGNOSIS — E1140 Type 2 diabetes mellitus with diabetic neuropathy, unspecified: Secondary | ICD-10-CM | POA: Diagnosis not present

## 2019-10-28 LAB — RENAL FUNCTION PANEL
Albumin: 4.9 g/dL (ref 3.5–5.2)
BUN: 29 mg/dL — ABNORMAL HIGH (ref 6–23)
CO2: 27 mEq/L (ref 19–32)
Calcium: 10 mg/dL (ref 8.4–10.5)
Chloride: 96 mEq/L (ref 96–112)
Creatinine, Ser: 1.05 mg/dL (ref 0.40–1.50)
GFR: 73.27 mL/min (ref >60.00)
Glucose, Bld: 262 mg/dL — ABNORMAL HIGH (ref 70–99)
Phosphorus: 3.6 mg/dL (ref 2.3–4.6)
Potassium: 3.5 mEq/L (ref 3.5–5.1)
Sodium: 134 mEq/L — ABNORMAL LOW (ref 135–145)

## 2019-10-28 LAB — MICROALBUMIN / CREATININE URINE RATIO
Creatinine,U: 146.7 mg/dL
Microalb Creat Ratio: 1.4 mg/g (ref 0.0–30.0)
Microalb, Ur: 2 mg/dL — ABNORMAL HIGH (ref 0.0–1.9)

## 2019-10-28 LAB — HEMOGLOBIN A1C: Hgb A1c MFr Bld: 9.2 % — ABNORMAL HIGH (ref 4.6–6.5)

## 2019-10-28 LAB — HM DIABETES FOOT EXAM

## 2019-10-28 MED ORDER — TRAMADOL HCL 50 MG PO TABS: 50.0000 mg | ORAL_TABLET | Freq: Three times a day (TID) | ORAL | 0 refills | Status: DC | PRN

## 2019-10-28 NOTE — Assessment & Plan Note (Signed)
New diagnosis  Seems to be okay on the metformin--but will double to bid if A1c not under 7% Set up with counseling Eye exam No Rx for neuropathy as yet

## 2019-10-28 NOTE — Progress Notes (Signed)
Subjective:    Patient ID: Joseph Jackson, male    DOB: 01/05/1966, 54 y.o.   MRN: [id number]  HPI Here for review of newly diagnosed diabetes This visit occurred during the SARS-CoV-2 public health emergency.  Safety protocols were in place, including screening questions prior to the visit, additional usage of staff PPE, and extensive cleaning of exam room while observing appropriate contact time as indicated for disinfecting solutions.   Checks his sugars occasionally Usually 120--random times Has neuropathy symptoms---hands are carpal tunnel, has trigger finger, etc Has leg neuropathy that is likely diabetic Has discomfort--but no striking pain (also tingling, numbness)  Checking BP at home 130/90 is the highest on the medication No chest pain Does get some DOE---with substantial exertion (thinks it may be respiratory--has inhaler) No dizziness or syncope   Current Outpatient Medications on File Prior to Visit  Medication Sig Dispense Refill  . albuterol (PROVENTIL HFA;VENTOLIN HFA) 108 (90 BASE) MCG/ACT inhaler Inhale 2 puffs into the lungs 3 (three) times daily as needed. 1 Inhaler 0  . calcipotriene (DOVONOX) 0.005 % cream Apply topically as needed. 60 g 1  . clobetasol ointment (TEMOVATE) 9.67 % Apply 1 application topically as needed. 30 g 1  . fluticasone (FLONASE) 50 MCG/ACT nasal spray Place 2 sprays into the nose daily.    Marland Kitchen ibuprofen (ADVIL) 800 MG tablet Take 1 tablet (800 mg total) by mouth every 6 (six) hours as needed. 100 tablet 3  . metFORMIN (GLUCOPHAGE-XR) 500 MG 24 hr tablet Take 1 tablet (500 mg total) by mouth daily with breakfast. 90 tablet 3  . olopatadine (PATANOL) 0.1 % ophthalmic solution Place 1 drop into both eyes 2 (two) times daily. 5 mL 5  . pantoprazole (PROTONIX) 40 MG tablet Take 1 tablet (40 mg total) by mouth daily. 90 tablet 3  . traMADol (ULTRAM) 50 MG tablet Take 1 tablet (50 mg total) by mouth 3 (three) times daily as needed. 30 tablet 0    . triamterene-hydrochlorothiazide (MAXZIDE-25) 37.5-25 MG tablet Take 1 tablet by mouth daily. 90 tablet 3   No current facility-administered medications on file prior to visit.    Allergies  Allergen Reactions  . Aspirin     REACTION: swelling    Past Medical History:  Diagnosis Date  . Allergy   . Anxiety   . Asthma   . Dermatitis due to drugs and medicines taken internally(693.0)   . GERD (gastroesophageal reflux disease)   . Nephrolithiasis 6/16   Passed kidney stone  . Pure hyperglyceridemia   . Unspecified sleep apnea     Past Surgical History:  Procedure Laterality Date  . COLONOSCOPY WITH PROPOFOL N/A 06/03/2018   Procedure: COLONOSCOPY WITH PROPOFOL;  Surgeon: Jonathon Bellows, MD;  Location: [facility] END[facility];  Service: Gastroenterology;  Laterality: N/A;  . US ECHOCARDIOGRAPHY  10/07   normal, holter benign   . WISDOM TOOTH EXTRACTION      Family History  Problem Relation Age of Onset  . Cancer Mother   . Stroke Father   . Heart disease Father        MI due to aortic tear  . Spondylolysis Father   . Mental illness Sister   . Diabetes Neg Hx     Social History   Socioeconomic History  . Marital status: Married    Spouse name: Not on file  . Number of children: 2  . Years of education: Not on file  . Highest education level: Not on file  Occupational  History  . 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 Smoking status: Never Smoker  . Smokeless tobacco: Never Used  Substance and Sexual Activity  . Alcohol use: No    Alcohol/week: 0.0 standard drinks    Comment: occasional  . Drug use: No  . Sexual activity: Not on file  Other Topics Concern  . Not on file  Social History Narrative  . Not on file   Social Determinants of Health   Financial Resource Strain:   . Difficulty of Paying Living Expenses:   Food Insecurity:   . Worried About Charity fundraiser in the Last Year:   . Arboriculturist in the Last Year:   Transportation  Needs:   . Film/video editor (Medical):   Marland Kitchen Lack of Transportation (Non-Medical):   Physical Activity:   . Days of Exercise per Week:   . Minutes of Exercise per Session:   Stress:   . Feeling of Stress :   Social Connections:   . Frequency of Communication with Friends and Family:   . Frequency of Social Gatherings with Friends and Family:   . Attends Religious Services:   . Active Member of Clubs or Organizations:   . Attends Archivist Meetings:   Marland Kitchen Marital Status:   Intimate Partner Violence:   . Fear of Current or Ex-Partner:   . Emotionally Abused:   Marland Kitchen Physically Abused:   . Sexually Abused:    Review of Systems Appetite is okay Weight up slightly    Objective:   Physical Exam Constitutional:      Appearance: Normal appearance.  Cardiovascular:     Rate and Rhythm: Normal rate and regular rhythm.     Pulses: Normal pulses.     Heart sounds: No murmur heard.  No gallop.   Pulmonary:     Effort: Pulmonary effort is normal.     Breath sounds: Normal breath sounds. No wheezing or rales.  Musculoskeletal:     Cervical back: Neck supple.     Right lower leg: No edema.     Left lower leg: No edema.  Lymphadenopathy:     Cervical: No cervical adenopathy.  Skin:    Comments: No foot lesions  Neurological:     Mental Status: He is alert.     Comments: Decreased sensation in feet  Psychiatric:        Mood and Affect: Mood normal.        Behavior: Behavior normal.            Assessment & Plan:

## 2019-10-28 NOTE — Assessment & Plan Note (Signed)
BP Readings from Last 3 Encounters:  10/28/19 120/90  04/29/19 136/86  06/03/18 (!) 140/98   Better now on the Rx Will check urine microal--start ACEI/ARB if positive

## 2019-10-29 ENCOUNTER — Other Ambulatory Visit: Payer: Self-pay

## 2019-10-29 MED ORDER — METFORMIN HCL ER 500 MG PO TB24: 1500.0000 mg | ORAL_TABLET | Freq: Every day | ORAL | 3 refills | Status: DC

## 2019-12-17 LAB — HM DIABETES EYE EXAM

## 2020-01-08 ENCOUNTER — Other Ambulatory Visit: Admission: RE | Admit: 2020-01-08 | Payer: 59 | Source: Ambulatory Visit

## 2020-01-11 ENCOUNTER — Other Ambulatory Visit: Payer: Self-pay

## 2020-01-11 ENCOUNTER — Other Ambulatory Visit: Payer: Self-pay | Admitting: Orthopedic Surgery

## 2020-01-11 ENCOUNTER — Encounter
Admission: RE | Admit: 2020-01-11 | Discharge: 2020-01-11 | Disposition: A | Discharge status: Home / Self Care | Payer: 59 | Source: Ambulatory Visit | Attending: Orthopedic Surgery | Admitting: Orthopedic Surgery

## 2020-01-11 HISTORY — DX: Other intervertebral disc degeneration, lumbar region: M51.36

## 2020-01-11 HISTORY — DX: Personal history of urinary calculi: Z87.442

## 2020-01-11 HISTORY — DX: Cardiac arrhythmia, unspecified: I49.9

## 2020-01-11 HISTORY — DX: Other intervertebral disc degeneration, lumbar region without mention of lumbar back pain or lower extremity pain: M51.369

## 2020-01-11 HISTORY — DX: Other specified postprocedural states: Z98.890

## 2020-01-11 HISTORY — DX: Nausea with vomiting, unspecified: R11.2

## 2020-01-11 NOTE — Patient Instructions (Addendum)
Your procedure is scheduled on: Thurs 10/14 Report to Day Surgery. Medical mall To find out your arrival time please call [phone] between 1PM - 3PM on Wed. 10/13 .  Remember: Instructions that are not followed completely may result in serious medical risk,  up to and including death, or upon the discretion of your surgeon and anesthesiologist your  surgery may need to be rescheduled.     _X__ 1. Do not eat food after midnight the night before your procedure.                 No chewing gum or hard candies. You may drink clear liquids up to 2 hours                 before you are scheduled to arrive for your surgery- DO not drink clear                 liquids within 2 hours of the start of your surgery.                 Clear Liquids include:  water,  G2 or                  Gatorade Zero (avoid Red/Purple/Blue), Black Coffee or Tea (Do not add                 anything to coffee or tea). _____2.   Complete the "Ensure Clear Pre-surgery Clear Carbohydrate Drink" provided to you, 2 hours before arrival. **If you       are diabetic you will be provided with an alternative drink, Gatorade Zero or G2.  __X__2.  On the morning of surgery brush your teeth with toothpaste and water, you                may rinse your mouth with mouthwash if you wish.  Do not swallow any toothpaste of mouthwash.     _X__ 3.  No Alcohol for 24 hours before or after surgery.   ___ 4.  Do Not Smoke or use e-cigarettes For 24 Hours Prior to Your Surgery.                 Do not use any chewable tobacco products for at least 6 hours prior to                 Surgery.  ___  5.  Do not use any recreational drugs (marijuana, cocaine, heroin, ecstasy, MDMA or other)                For at least one week prior to your surgery.  Combination of these drugs with anesthesia                May have life threatening results.  ____  6.  Bring all medications with you on the day of surgery if instructed.    __x__  7.  Notify your doctor if there is any change in your medical condition      (cold, fever, infections).     Do not wear jewelry, Do not wear lotions, no deodorant. Do not shave 48 hours prior to surgery. Men may shave face and neck. Do not bring valuables to the hospital.    [facility] is not responsible for any belongings or valuables.  Contacts, dentures or bridgework may not be worn into surgery. Leave your suitcase in the car. After surgery it may be brought to your room.  For patients admitted to the hospital, discharge time is determined by your treatment team.   Patients discharged the day of surgery will not be allowed to drive home.   Make arrangements for someone to be with you for the first 24 hours of your Same Day Discharge.    Please read over the following fact sheets that you were given:    __x__ Take these medicines the morning of surgery with A SIP OF WATER:    1. fluticasone (FLONASE) 50 MCG/ACT nasal spray if needed  2. pantoprazole (PROTONIX) 40 MG tablet night before and morning of surgery  3. traMADol (ULTRAM) 50 MG tablet if needed  4.levocetirizine (XYZAL) 5 MG tablet  5.  6.  ____ Fleet Enema (as directed)   __x__ Use CHG Soap (or wipes) as directed  ____ Use Benzoyl Peroxide Gel as instructed  ____ Use inhalers on the day of surgery  x___ Stop metformin 2 days prior to surgery Last dose on 10/11   ____ Take 1/2 of usual insulin dose the night before surgery. No insulin the morning          of surgery  ____ Stop Coumadin/Plavix/aspirin on   _x___ Stop Anti-inflammatories ibuprofen aleve 1 week prior to surgery   _x___ Stop supplements until after surgery.  Tumeric 1 week prior to surgery  ____ Bring C-Pap to the hospital.    If you have any questions regarding your pre-procedure instructions,  Please call Pre-admit Testing at [facility]

## 2020-01-12 ENCOUNTER — Other Ambulatory Visit: Payer: 59

## 2020-01-13 ENCOUNTER — Encounter
Admission: RE | Admit: 2020-01-13 | Discharge: 2020-01-13 | Disposition: A | Discharge status: Home / Self Care | Payer: 59 | Source: Ambulatory Visit | Attending: Orthopedic Surgery | Admitting: Orthopedic Surgery

## 2020-01-13 ENCOUNTER — Other Ambulatory Visit: Payer: Self-pay

## 2020-01-13 DIAGNOSIS — I440 Atrioventricular block, first degree: Secondary | ICD-10-CM | POA: Diagnosis not present

## 2020-01-13 DIAGNOSIS — I10 Essential (primary) hypertension: Secondary | ICD-10-CM | POA: Insufficient documentation

## 2020-01-13 DIAGNOSIS — Z01818 Encounter for other preprocedural examination: Secondary | ICD-10-CM | POA: Diagnosis present

## 2020-01-13 LAB — BASIC METABOLIC PANEL
Anion gap: 10 (ref 5–15)
BUN: 14 mg/dL (ref 6–20)
CO2: 28 mmol/L (ref 22–32)
Calcium: 9.8 mg/dL (ref 8.9–10.3)
Chloride: 100 mmol/L (ref 98–111)
Creatinine, Ser: 0.88 mg/dL (ref 0.61–1.24)
GFR calc Af Amer: >60 mL/min (ref >60)
GFR calc non Af Amer: >60 mL/min (ref >60)
Glucose, Bld: 147 mg/dL — ABNORMAL HIGH (ref 70–99)
Potassium: 3.2 mmol/L — ABNORMAL LOW (ref 3.5–5.1)
Sodium: 138 mmol/L (ref 135–145)

## 2020-01-13 LAB — CBC WITH DIFFERENTIAL/PLATELET
Abs Immature Granulocytes: 0.03 10*3/uL (ref 0.00–0.07)
Basophils Absolute: 0.1 10*3/uL (ref 0.0–0.1)
Basophils Relative: 1 %
Eosinophils Absolute: 0.1 10*3/uL (ref 0.0–0.5)
Eosinophils Relative: 2 %
HCT: 39.9 % (ref 39.0–52.0)
Hemoglobin: 14.7 g/dL (ref 13.0–17.0)
Immature Granulocytes: 1 %
Lymphocytes Relative: 35 %
Lymphs Abs: 2.3 10*3/uL (ref 0.7–4.0)
MCH: 30.7 pg (ref 26.0–34.0)
MCHC: 36.8 g/dL — ABNORMAL HIGH (ref 30.0–36.0)
MCV: 83.3 fL (ref 80.0–100.0)
Monocytes Absolute: 0.6 10*3/uL (ref 0.1–1.0)
Monocytes Relative: 9 %
Neutro Abs: 3.4 10*3/uL (ref 1.7–7.7)
Neutrophils Relative %: 52 %
Platelets: 197 10*3/uL (ref 150–400)
RBC: 4.79 MIL/uL (ref 4.22–5.81)
RDW: 11.8 % (ref 11.5–15.5)
WBC: 6.5 10*3/uL (ref 4.0–10.5)
nRBC: 0 % (ref 0.0–0.2)

## 2020-01-13 LAB — PROTIME-INR
INR: 1 (ref 0.8–1.2)
Prothrombin Time: 12.6 seconds (ref 11.4–15.2)

## 2020-01-13 LAB — APTT: aPTT: 31 seconds (ref 24–36)

## 2020-01-13 NOTE — Progress Notes (Signed)
°  [facility] Perioperative Services: Pre-Admission/Anesthesia Testing  Abnormal Lab Notification   Date: 01/13/20  Name: Joseph Jackson MRN:   [id number]  Re: Abnormal labs noted during PAT appointment   Provider(s) Notified: Thornton Park, MD Notification mode: Routed and/or faxed via [facility]   ABNORMAL LAB VALUE(S): Lab Results  Component Value Date   K 3.2 (L) 01/13/2020    Notes:  Patient on daily thiazide diuretic therapy. Patient is scheduled for a CTR on 01/14/2020. Will send result to surgeon for review and consideration of possible supplementation. Will also have SDS staff recheck K+ level on the day of surgery to ensure that K+ at an acceptable level for surgery/anesthesia. This is a Community education officer; no formal response is required.  Honor Loh, MSN, APRN, FNP-C, CEN [facility]  Peri-operative Services Nurse Practitioner Phone: [phone] 01/13/20 11:31 AM

## 2020-01-26 ENCOUNTER — Other Ambulatory Visit: Payer: Self-pay

## 2020-01-26 ENCOUNTER — Encounter
Admission: RE | Admit: 2020-01-26 | Discharge: 2020-01-26 | Disposition: A | Discharge status: Home / Self Care | Payer: 59 | Source: Ambulatory Visit | Attending: Orthopedic Surgery | Admitting: Orthopedic Surgery

## 2020-01-26 DIAGNOSIS — Z01812 Encounter for preprocedural laboratory examination: Secondary | ICD-10-CM | POA: Diagnosis not present

## 2020-01-26 DIAGNOSIS — Z20822 Contact with and (suspected) exposure to covid-19: Secondary | ICD-10-CM | POA: Insufficient documentation

## 2020-01-27 LAB — SARS CORONAVIRUS 2 (TAT 6-24 HRS): SARS Coronavirus 2: NEGATIVE

## 2020-01-28 ENCOUNTER — Other Ambulatory Visit: Payer: Self-pay

## 2020-01-28 ENCOUNTER — Ambulatory Visit
Admission: RE | Admit: 2020-01-28 | Discharge: 2020-01-28 | Disposition: A | Discharge status: Home / Self Care | Payer: 59 | Attending: Orthopedic Surgery | Admitting: Orthopedic Surgery

## 2020-01-28 ENCOUNTER — Ambulatory Visit: Payer: 59 | Admitting: Urgent Care

## 2020-01-28 ENCOUNTER — Encounter
Admission: RE | Disposition: A | Discharge status: Home / Self Care | Payer: Self-pay | Source: Home / Self Care | Attending: Orthopedic Surgery

## 2020-01-28 ENCOUNTER — Encounter: Payer: Self-pay | Admitting: Orthopedic Surgery

## 2020-01-28 DIAGNOSIS — Z7951 Long term (current) use of inhaled steroids: Secondary | ICD-10-CM | POA: Insufficient documentation

## 2020-01-28 DIAGNOSIS — G4730 Sleep apnea, unspecified: Secondary | ICD-10-CM | POA: Insufficient documentation

## 2020-01-28 DIAGNOSIS — G5602 Carpal tunnel syndrome, left upper limb: Secondary | ICD-10-CM | POA: Insufficient documentation

## 2020-01-28 DIAGNOSIS — I10 Essential (primary) hypertension: Secondary | ICD-10-CM | POA: Diagnosis not present

## 2020-01-28 DIAGNOSIS — Z818 Family history of other mental and behavioral disorders: Secondary | ICD-10-CM | POA: Diagnosis not present

## 2020-01-28 DIAGNOSIS — F419 Anxiety disorder, unspecified: Secondary | ICD-10-CM | POA: Diagnosis not present

## 2020-01-28 DIAGNOSIS — Z87442 Personal history of urinary calculi: Secondary | ICD-10-CM | POA: Insufficient documentation

## 2020-01-28 DIAGNOSIS — E781 Pure hyperglyceridemia: Secondary | ICD-10-CM | POA: Diagnosis not present

## 2020-01-28 DIAGNOSIS — Z886 Allergy status to analgesic agent status: Secondary | ICD-10-CM | POA: Insufficient documentation

## 2020-01-28 DIAGNOSIS — Z809 Family history of malignant neoplasm, unspecified: Secondary | ICD-10-CM | POA: Diagnosis not present

## 2020-01-28 DIAGNOSIS — M65332 Trigger finger, left middle finger: Secondary | ICD-10-CM | POA: Diagnosis not present

## 2020-01-28 DIAGNOSIS — Z8249 Family history of ischemic heart disease and other diseases of the circulatory system: Secondary | ICD-10-CM | POA: Diagnosis not present

## 2020-01-28 DIAGNOSIS — J45909 Unspecified asthma, uncomplicated: Secondary | ICD-10-CM | POA: Insufficient documentation

## 2020-01-28 DIAGNOSIS — E119 Type 2 diabetes mellitus without complications: Secondary | ICD-10-CM | POA: Diagnosis not present

## 2020-01-28 DIAGNOSIS — K219 Gastro-esophageal reflux disease without esophagitis: Secondary | ICD-10-CM | POA: Insufficient documentation

## 2020-01-28 DIAGNOSIS — Z889 Allergy status to unspecified drugs, medicaments and biological substances status: Secondary | ICD-10-CM | POA: Diagnosis not present

## 2020-01-28 DIAGNOSIS — Z823 Family history of stroke: Secondary | ICD-10-CM | POA: Insufficient documentation

## 2020-01-28 DIAGNOSIS — Z8269 Family history of other diseases of the musculoskeletal system and connective tissue: Secondary | ICD-10-CM | POA: Insufficient documentation

## 2020-01-28 DIAGNOSIS — Z79899 Other long term (current) drug therapy: Secondary | ICD-10-CM | POA: Insufficient documentation

## 2020-01-28 HISTORY — PX: TRIGGER FINGER RELEASE: SHX641

## 2020-01-28 HISTORY — PX: CARPAL TUNNEL RELEASE: SHX101

## 2020-01-28 LAB — POCT I-STAT, CHEM 8
BUN: 18 mg/dL (ref 6–20)
Calcium, Ion: 1.19 mmol/L (ref 1.15–1.40)
Chloride: 103 mmol/L (ref 98–111)
Creatinine, Ser: 0.9 mg/dL (ref 0.61–1.24)
Glucose, Bld: 160 mg/dL — ABNORMAL HIGH (ref 70–99)
HCT: 42 % (ref 39.0–52.0)
Hemoglobin: 14.3 g/dL (ref 13.0–17.0)
Potassium: 3.5 mmol/L (ref 3.5–5.1)
Sodium: 140 mmol/L (ref 135–145)
TCO2: 23 mmol/L (ref 22–32)

## 2020-01-28 LAB — GLUCOSE, CAPILLARY: Glucose-Capillary: 162 mg/dL — ABNORMAL HIGH (ref 70–99)

## 2020-01-28 SURGERY — CARPAL TUNNEL RELEASE
Anesthesia: General | Site: Finger | Laterality: Left

## 2020-01-28 MED ORDER — FENTANYL CITRATE (PF) 100 MCG/2ML IJ SOLN
INTRAMUSCULAR | Status: AC
  Administered 2020-01-28: 25 ug via INTRAVENOUS
  Filled 2020-01-28: qty 2

## 2020-01-28 MED ORDER — OXYCODONE HCL 5 MG PO TABS: 5.0000 mg | ORAL_TABLET | Freq: Once | ORAL | Status: AC

## 2020-01-28 MED ORDER — MIDAZOLAM HCL 2 MG/2ML IJ SOLN
INTRAMUSCULAR | Status: DC | PRN
  Administered 2020-01-28: 2 mg via INTRAVENOUS

## 2020-01-28 MED ORDER — BUPIVACAINE-EPINEPHRINE (PF) 0.25% -1:200000 IJ SOLN
INTRAMUSCULAR | Status: AC
  Filled 2020-01-28: qty 60

## 2020-01-28 MED ORDER — EPINEPHRINE PF 1 MG/ML IJ SOLN
INTRAMUSCULAR | Status: AC
  Filled 2020-01-28: qty 4

## 2020-01-28 MED ORDER — OXYCODONE HCL 5 MG PO TABS
ORAL_TABLET | ORAL | Status: AC
  Administered 2020-01-28: 5 mg via ORAL
  Filled 2020-01-28: qty 1

## 2020-01-28 MED ORDER — DEXAMETHASONE SODIUM PHOSPHATE 10 MG/ML IJ SOLN
INTRAMUSCULAR | Status: AC
  Filled 2020-01-28: qty 1

## 2020-01-28 MED ORDER — ACETAMINOPHEN 500 MG PO TABS
ORAL_TABLET | ORAL | Status: AC
  Administered 2020-01-28: 1000 mg via ORAL
  Filled 2020-01-28: qty 2

## 2020-01-28 MED ORDER — ACETAMINOPHEN 500 MG PO TABS: 1000.0000 mg | ORAL_TABLET | ORAL | Status: AC

## 2020-01-28 MED ORDER — OXYCODONE HCL 5 MG PO TABS: 5.0000 mg | ORAL_TABLET | ORAL | 0 refills | Status: DC | PRN

## 2020-01-28 MED ORDER — ORAL CARE MOUTH RINSE: 15.0000 mL | Freq: Once | OROMUCOSAL | Status: AC

## 2020-01-28 MED ORDER — SODIUM CHLORIDE 0.9 % IV SOLN: INTRAVENOUS | Status: DC

## 2020-01-28 MED ORDER — FENTANYL CITRATE (PF) 100 MCG/2ML IJ SOLN
INTRAMUSCULAR | Status: DC | PRN
Start: 2020-01-28 — End: 2020-01-28
  Administered 2020-01-28 (×2): 50 ug via INTRAVENOUS

## 2020-01-28 MED ORDER — LIDOCAINE HCL URETHRAL/MUCOSAL 2 % EX GEL
CUTANEOUS | Status: AC
  Filled 2020-01-28: qty 5

## 2020-01-28 MED ORDER — LABETALOL HCL 5 MG/ML IV SOLN
INTRAVENOUS | Status: AC
  Administered 2020-01-28: 5 mg via INTRAVENOUS
  Filled 2020-01-28: qty 4

## 2020-01-28 MED ORDER — LIDOCAINE HCL (CARDIAC) PF 100 MG/5ML IV SOSY
PREFILLED_SYRINGE | INTRAVENOUS | Status: DC | PRN
  Administered 2020-01-28: 100 mg via INTRAVENOUS

## 2020-01-28 MED ORDER — CEFAZOLIN SODIUM-DEXTROSE 2-4 GM/100ML-% IV SOLN
INTRAVENOUS | Status: AC
  Filled 2020-01-28: qty 100

## 2020-01-28 MED ORDER — ONDANSETRON HCL 4 MG/2ML IJ SOLN
INTRAMUSCULAR | Status: AC
  Filled 2020-01-28: qty 2

## 2020-01-28 MED ORDER — FENTANYL CITRATE (PF) 100 MCG/2ML IJ SOLN
25.0000 ug | INTRAMUSCULAR | Status: AC | PRN
  Administered 2020-01-28 (×2): 25 ug via INTRAVENOUS

## 2020-01-28 MED ORDER — LIDOCAINE HCL (PF) 1 % IJ SOLN
INTRAMUSCULAR | Status: AC
  Filled 2020-01-28: qty 30

## 2020-01-28 MED ORDER — CHLORHEXIDINE GLUCONATE 0.12 % MT SOLN: 15.0000 mL | Freq: Once | OROMUCOSAL | Status: AC

## 2020-01-28 MED ORDER — FENTANYL CITRATE (PF) 100 MCG/2ML IJ SOLN
25.0000 ug | INTRAMUSCULAR | Status: DC | PRN
  Administered 2020-01-28 (×3): 25 ug via INTRAVENOUS

## 2020-01-28 MED ORDER — BUPIVACAINE HCL (PF) 0.5 % IJ SOLN
INTRAMUSCULAR | Status: AC
  Filled 2020-01-28: qty 30

## 2020-01-28 MED ORDER — ONDANSETRON HCL 4 MG/2ML IJ SOLN
INTRAMUSCULAR | Status: DC | PRN
  Administered 2020-01-28: 4 mg via INTRAVENOUS

## 2020-01-28 MED ORDER — PROPOFOL 500 MG/50ML IV EMUL
INTRAVENOUS | Status: AC
  Filled 2020-01-28: qty 50

## 2020-01-28 MED ORDER — ONDANSETRON HCL 4 MG PO TABS: 4.0000 mg | ORAL_TABLET | Freq: Three times a day (TID) | ORAL | 0 refills | Status: DC | PRN

## 2020-01-28 MED ORDER — FENTANYL CITRATE (PF) 100 MCG/2ML IJ SOLN
INTRAMUSCULAR | Status: AC
  Filled 2020-01-28: qty 2

## 2020-01-28 MED ORDER — CHLORHEXIDINE GLUCONATE CLOTH 2 % EX PADS: 6.0000 | MEDICATED_PAD | Freq: Once | CUTANEOUS | Status: DC

## 2020-01-28 MED ORDER — PROMETHAZINE HCL 25 MG/ML IJ SOLN
6.2500 mg | INTRAMUSCULAR | Status: DC | PRN
  Administered 2020-01-28: 6.25 mg via INTRAVENOUS

## 2020-01-28 MED ORDER — LABETALOL HCL 5 MG/ML IV SOLN: 10.0000 mg | INTRAVENOUS | Status: DC | PRN

## 2020-01-28 MED ORDER — FENTANYL CITRATE (PF) 100 MCG/2ML IJ SOLN
INTRAMUSCULAR | Status: AC
  Administered 2020-01-28: 50 ug via INTRAVENOUS
  Filled 2020-01-28: qty 2

## 2020-01-28 MED ORDER — CEFAZOLIN SODIUM-DEXTROSE 2-4 GM/100ML-% IV SOLN
2.0000 g | INTRAVENOUS | Status: AC
  Administered 2020-01-28: 2 g via INTRAVENOUS

## 2020-01-28 MED ORDER — PROPOFOL 10 MG/ML IV BOLUS
INTRAVENOUS | Status: DC | PRN
  Administered 2020-01-28: 200 mg via INTRAVENOUS

## 2020-01-28 MED ORDER — LABETALOL HCL 5 MG/ML IV SOLN: 5.0000 mg | Freq: Once | INTRAVENOUS | Status: AC

## 2020-01-28 MED ORDER — BUPIVACAINE HCL (PF) 0.25 % IJ SOLN
INTRAMUSCULAR | Status: AC
  Filled 2020-01-28: qty 60

## 2020-01-28 MED ORDER — CHLORHEXIDINE GLUCONATE 0.12 % MT SOLN
OROMUCOSAL | Status: AC
  Administered 2020-01-28: 15 mL via OROMUCOSAL
  Filled 2020-01-28: qty 15

## 2020-01-28 MED ORDER — DEXAMETHASONE SODIUM PHOSPHATE 10 MG/ML IJ SOLN
INTRAMUSCULAR | Status: DC | PRN
  Administered 2020-01-28: 10 mg via INTRAVENOUS

## 2020-01-28 MED ORDER — PROMETHAZINE HCL 25 MG/ML IJ SOLN
INTRAMUSCULAR | Status: AC
  Filled 2020-01-28: qty 1

## 2020-01-28 MED ORDER — MIDAZOLAM HCL 2 MG/2ML IJ SOLN
INTRAMUSCULAR | Status: AC
  Filled 2020-01-28: qty 2

## 2020-01-28 MED ORDER — NEOMYCIN-POLYMYXIN B GU 40-200000 IR SOLN
Status: DC | PRN
  Administered 2020-01-28: 2 mL

## 2020-01-28 SURGICAL SUPPLY — 58 items
BLADE SURG MINI STRL (BLADE) ×4 IMPLANT
BNDG CMPR STD VLCR NS LF 5.8X4 (GAUZE/BANDAGES/DRESSINGS) ×4
BNDG ELASTIC 4X5.8 VLCR NS LF (GAUZE/BANDAGES/DRESSINGS) ×8 IMPLANT
BNDG ESMARK 4X12 TAN STRL LF (GAUZE/BANDAGES/DRESSINGS) ×4 IMPLANT
CANISTER SUCT 1200ML W/VALVE (MISCELLANEOUS) ×4 IMPLANT
CLOSURE WOUND 1/2 X4 (GAUZE/BANDAGES/DRESSINGS) ×1
CLOSURE WOUND 1/4X4 (GAUZE/BANDAGES/DRESSINGS) ×1
CORD BIP STRL DISP 12FT (MISCELLANEOUS) ×4 IMPLANT
COVER WAND RF STERILE (DRAPES) ×4 IMPLANT
CUFF DUAL TOURNIQUET 18IN DISP (TOURNIQUET CUFF) IMPLANT
CUFF TOURN 24 STER (MISCELLANEOUS) IMPLANT
CUFF TOURN SGL QUICK 18X4 (TOURNIQUET CUFF) IMPLANT
DRAPE SPLIT 6X30 W/TAPE (DRAPES) ×4 IMPLANT
DRAPE STERI IOBAN 125X83 (DRAPES) ×4 IMPLANT
DRAPE SURG 17X11 SM STRL (DRAPES) ×4 IMPLANT
DRSG GAUZE FLUFF 36X18 (GAUZE/BANDAGES/DRESSINGS) ×8 IMPLANT
DURAPREP 26ML APPLICATOR (WOUND CARE) ×8 IMPLANT
ELECT REM PT RETURN 9FT ADLT (ELECTROSURGICAL) ×4
ELECTRODE REM PT RTRN 9FT ADLT (ELECTROSURGICAL) ×2 IMPLANT
FORCEPS JEWEL BIP 4-3/4 STR (INSTRUMENTS) ×4 IMPLANT
GAUZE SPONGE 4X4 12PLY STRL (GAUZE/BANDAGES/DRESSINGS) ×4 IMPLANT
GAUZE XEROFORM 1X8 LF (GAUZE/BANDAGES/DRESSINGS) ×4 IMPLANT
GLOVE BIOGEL PI IND STRL 9 (GLOVE) ×2 IMPLANT
GLOVE BIOGEL PI INDICATOR 9 (GLOVE) ×2
GLOVE SURG 9.0 ORTHO LTXF (GLOVE) ×8 IMPLANT
GOWN STRL REUS TWL 2XL XL LVL4 (GOWN DISPOSABLE) ×4 IMPLANT
GOWN STRL REUS W/ TWL LRG LVL3 (GOWN DISPOSABLE) ×2 IMPLANT
GOWN STRL REUS W/TWL 2XL LVL3 (GOWN DISPOSABLE) ×4 IMPLANT
GOWN STRL REUS W/TWL LRG LVL3 (GOWN DISPOSABLE) ×4
KIT TURNOVER KIT A (KITS) ×4 IMPLANT
NEEDLE FILTER BLUNT 18X 1/2SAF (NEEDLE) ×2
NEEDLE FILTER BLUNT 18X1 1/2 (NEEDLE) ×2 IMPLANT
NEEDLE HYPO 25GX1X1/2 BEV (NEEDLE) ×4 IMPLANT
NS IRRIG 500ML POUR BTL (IV SOLUTION) ×4 IMPLANT
PACK EXTREMITY (MISCELLANEOUS) ×4 IMPLANT
PAD ABD DERMACEA PRESS 5X9 (GAUZE/BANDAGES/DRESSINGS) ×8 IMPLANT
PAD CAST CTTN 4X4 STRL (SOFTGOODS) ×4 IMPLANT
PADDING CAST 4IN STRL (MISCELLANEOUS) ×6
PADDING CAST BLEND 4X4 STRL (MISCELLANEOUS) ×6 IMPLANT
PADDING CAST COTTON 4X4 STRL (SOFTGOODS) ×8
SLING ARM LRG DEEP (SOFTGOODS) ×4 IMPLANT
SLING ARM M TX990204 (SOFTGOODS) ×4 IMPLANT
SPLINT CAST 1 STEP 3X12 (MISCELLANEOUS) ×8 IMPLANT
STOCKINETTE STRL 4IN 9604848 (GAUZE/BANDAGES/DRESSINGS) ×4 IMPLANT
STRIP CLOSURE SKIN 1/2X4 (GAUZE/BANDAGES/DRESSINGS) ×3 IMPLANT
STRIP CLOSURE SKIN 1/4X4 (GAUZE/BANDAGES/DRESSINGS) ×3 IMPLANT
SUT ETHILON 3-0 FS-10 30 BLK (SUTURE) ×4
SUT ETHILON 4-0 (SUTURE) ×4
SUT ETHILON 4-0 FS2 18XMFL BLK (SUTURE) ×2
SUT ETHILON 5-0 FS-2 18 BLK (SUTURE) ×4 IMPLANT
SUT MNCRL 4-0 (SUTURE) ×4
SUT MNCRL 4-0 27XMFL (SUTURE) ×2
SUT MNCRL AB 3-0 PS2 18 (SUTURE) ×4 IMPLANT
SUTURE EHLN 3-0 FS-10 30 BLK (SUTURE) ×2 IMPLANT
SUTURE ETHLN 4-0 FS2 18XMF BLK (SUTURE) ×2 IMPLANT
SUTURE MNCRL 4-0 27XMF (SUTURE) ×2 IMPLANT
SYR 10ML LL (SYRINGE) ×4 IMPLANT
SYR 3ML LL SCALE MARK (SYRINGE) ×4 IMPLANT

## 2020-01-28 NOTE — Anesthesia Procedure Notes (Signed)
Procedure Name: LMA Insertion Date/Time: 01/28/2020 7:59 AM Performed by: Johnna Acosta, CRNA Pre-anesthesia Checklist: Patient identified, Emergency Drugs available, Suction available, Patient being monitored and Timeout performed Patient Re-evaluated:Patient Re-evaluated prior to induction Oxygen Delivery Method: Circle system utilized Preoxygenation: Pre-oxygenation with 100% oxygen Induction Type: IV induction LMA: LMA inserted LMA Size: 5.0 Tube type: Oral Number of attempts: 1 Airway Equipment and Method: Oral airway Placement Confirmation: positive ETCO2 and breath sounds checked- equal and bilateral Tube secured with: Tape Dental Injury: Teeth and Oropharynx as per pre-operative assessment

## 2020-01-28 NOTE — Discharge Instructions (Signed)

## 2020-01-28 NOTE — Transfer of Care (Signed)
Immediate Anesthesia Transfer of Care Note  Patient: LINKEN MCGLOTHEN  Procedure(s) Performed: Left CARPAL TUNNEL RELEASE (Left ) Left middle finger RELEASE TRIGGER FINGER/A-1 PULLEY (Left Finger)  Patient Location: PACU  Anesthesia Type:General  Level of Consciousness: awake and drowsy  Airway & Oxygen Therapy: Patient Spontanous Breathing and Patient connected to face mask oxygen  Post-op Assessment: Report given to RN and Post -op Vital signs reviewed and stable  Post vital signs: Reviewed and stable  Last Vitals:  Vitals Value Taken Time  BP 207/87 01/28/20 0930  Temp 35.9 C 01/28/20 0930  Pulse 71 01/28/20 0936  Resp 14 01/28/20 0936  SpO2 100 % 01/28/20 0936  Vitals shown include unvalidated device data.  Last Pain:  Vitals:   01/28/20 0930  TempSrc:   PainSc: Asleep         Complications: No complications documented.

## 2020-01-28 NOTE — Op Note (Addendum)
01/28/2020  9:40 AM  PATIENT:  Joseph Jackson    PRE-OPERATIVE DIAGNOSIS:  Left Carpal Tunnel Syndrome and Left Middle Trigger Finger  POST-OPERATIVE DIAGNOSIS:  Same  PROCEDURE:  Left CARPAL TUNNEL RELEASE, Left middle finger RELEASE TRIGGER FINGER/A-1 PULLEY  SURGEON:  Thornton Park, MD  ANESTHESIA:   General  PREOPERATIVE INDICATIONS:  Joseph Jackson is a  54 y.o. male with a diagnosis of Left Carpal Tunnel Syndrome and Left Middle Trigger Finger who failed conservative measures and elected for surgical management.    I discussed the risks and benefits of surgery. The risks include but are not limited to infection, bleeding, nerve or blood vessel injury, joint stiffness or loss of motion, persistent pain, weakness, recurrence of symptoms and the need for further surgery. Medical risks include but are not limited to DVT and pulmonary embolism, myocardial infarction, stroke, pneumonia, respiratory failure and death. Patient understood these risks and wished to proceed.   OPERATIVE FINDINGS: Significant median nerve compression at the carpal tunnel, left upper extremity with thickened A1 pulley.  OPERATIVE PROCEDURE: Patient was met in the preoperative area. I signed the left wrist and middle finger with my initials and the word yes according the hospital's correct site of surgery protocol.  The sites of surgery were verbally confirmed with the patient.  The preop history and physical was performed at the bedside.. I answered all the patient's questions.   The patient was then brought to the operating room where he underwent general anesthesia.  The patient was positioned supine on the operative table. The left arm was placed on a hand table. A tourniquet was applied to the left upper extremity.  The patient was prepped and draped in a sterile fashion. A timeout was performed to verify the patient's name, date of birth, medical record number, correct site of surgery correct procedure  to be performed. The time out was also used to confirm the patient received antibiotics that all necessary instruments were available in the room.   The consent form was found to say the trigger finger release was for the left ring finger.  My office note was really checked and said left middle finger.  The patient's wife and daughter were contacted by phone to explain the situation.  I explained to them that my office note described symptoms in the left middle finger and the patient verbally had confirmed with me that the site of trigger finger release was left middle finger.  They agreed to proceed with a left middle finger trigger release and were informed that the consent form would be amended with the appropriate finger.  The left upper extremity was exsanguinated with an Esmarch and the tourniquet inflated to 250 mmHg for 59 minutes.   An incision following the palmar crease was made. This was made in line with the web space between the middle and ring fingers and the distal extent of the incision was where it intersected Kaplan's cardinal line. Bleeding vessels were cauterized with a bipolar.  The subcutaneous tissue was carefully dissected out with a Metzenbaum scissor and pickup until the palmar fascia was encountered. The distal extent of the transverse carpal ligament was then identified. A Freer elevator was placed under the transverse carpal ligament running distally to proximally. A micro-Beaver blade was then used to incise the transverse carpal ligament taking care to avoid injury to any neurovascular structures. The carpal tunnel was found to be extremely constricted. There was significant compression on the median nerve. The transverse  carpal ligament was completely released. The nerve was visualized in its entirety and the carpal tunnel. The wound was copiously irrigated.   The attention was then turned to middle finger trigger release.  A linear incision, in line with the middle finger and  over the A1 pulley was made.  The subcutaneous tissue was bluntly dissected with a hemostat.  A Heiss retractor was placed to allow for visualization of the A1 pulley.  The A1 pulley was carefully released with a micro Beaver blade, taking care to avoid injury to the underlying tendons or surrounding neurovascular structures.  Once the A1 pulley was adequately released the patient had full passive range of motion of his middle finger.  The wound was copiously irrigated.  The skin of both incisions was then approximated with 5-0 nylon.  Steri-Strips and Xeroform was placed over both incisions. A dry sterile dressing was applied along with a volar fiberglass splint. Patient was overwrapped with an Ace wrap. The tourniquet was deflated at 59 minutes.  Sling was placed on the left upper extremity.  The patient was extubated and brought to the PACU in stable condition. I was scrubbed and present the entire case and all sharp, sponge and instrument counts were correct at the conclusion the case.  At the conclusion the case, I amended the consent form and signed it.  This was also witnessed by the circulating nurse and nurse anesthetist who were present during the speaker phone conversation with the patient's son and wife.  They had witnessed my conversation with the family and their agreement to proceed with left middle trigger finger release.  I spoke with the patient's son from the PACU and left a voicemail message for his wife to let them know the case had been performed successfully and without complication and that the patient was stable in the recovery room.   Timoteo Gaul, MD

## 2020-01-28 NOTE — H&P (Addendum)
PREOPERATIVE H&P  Chief Complaint: Left Carpal Tunnel Syndrome and Left Middle Trigger Finger  HPI: Joseph Jackson is a 54 y.o. male who presents for preoperative history and physical with a diagnosis of Left Carpal Tunnel Syndrome and Left Middle Trigger Finger. Symptoms of paresthesias and limited digital motion of the left middle finger are significantly impairing activities of daily living.  He has agreed with surgical management.   Past Medical History:  Diagnosis Date  . Allergy   . Anxiety   . Asthma   . Bulging lumbar disc   . Controlled type 2 diabetes mellitus with diabetic neuropathy ([place])   . Dermatitis due to drugs and medicines taken internally(693.0)   . Dysrhythmia   . GERD (gastroesophageal reflux disease)   . History of kidney stones   . Hypertension   . PONV (postoperative nausea and vomiting)   . Pure hyperglyceridemia   . Unspecified sleep apnea    CPAP   Past Surgical History:  Procedure Laterality Date  . COLONOSCOPY WITH PROPOFOL N/A 06/03/2018   Procedure: COLONOSCOPY WITH PROPOFOL;  Surgeon: Jonathon Bellows, MD;  Location: [facility] ENDOSCOPY;  Service: Gastroenterology;  Laterality: N/A;  . US ECHOCARDIOGRAPHY  10/07   normal, holter benign   . WISDOM TOOTH EXTRACTION     Social History   Socioeconomic History  . Marital status: Married    Spouse name: Not on file  . Number of children: 2  . Years of education: Not on file  . Highest education level: Not on file  Occupational History  . 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 Smoking status: Never Smoker  . Smokeless tobacco: Never Used  Vaping Use  . Vaping Use: Never used  Substance and Sexual Activity  . Alcohol use: Yes    Alcohol/week: 0.0 standard drinks    Comment: occasional  . Drug use: No  . Sexual activity: Not on file  Other Topics Concern  . Not on file  Social History Narrative  . Not on file   Social Determinants of Health   Financial Resource Strain:   .  Difficulty of Paying Living Expenses: Not on file  Food Insecurity:   . Worried About Charity fundraiser in the Last Year: Not on file  . Ran Out of Food in the Last Year: Not on file  Transportation Needs:   . Lack of Transportation (Medical): Not on file  . Lack of Transportation (Non-Medical): Not on file  Physical Activity:   . Days of Exercise per Week: Not on file  . Minutes of Exercise per Session: Not on file  Stress:   . Feeling of Stress : Not on file  Social Connections:   . Frequency of Communication with Friends and Family: Not on file  . Frequency of Social Gatherings with Friends and Family: Not on file  . Attends Religious Services: Not on file  . Active Member of Clubs or Organizations: Not on file  . Attends Archivist Meetings: Not on file  . Marital Status: Not on file   Family History  Problem Relation Age of Onset  . Cancer Mother   . Stroke Father   . Heart disease Father        MI due to aortic tear  . Spondylolysis Father   . Mental illness Sister   . Diabetes Neg Hx    Allergies  Allergen Reactions  . Aspirin Swelling    Bufferin lips, hives  . Other Other (See Comments)  Ragweed, pollen, pine/ nasal congestion and wheezing   Prior to Admission medications   Medication Sig Start Date End Date Taking? Authorizing Provider  albuterol (PROVENTIL HFA;VENTOLIN HFA) 108 (90 BASE) MCG/ACT inhaler Inhale 2 puffs into the lungs 3 (three) times daily as needed. Patient not taking: Reported on 01/11/2020 04/20/14  Yes Baity, Coralie Keens, NP  calcipotriene (DOVONOX) 0.005 % cream Apply topically as needed. Patient taking differently: Apply 1 application topically 2 (two) times daily as needed (psoriasis).  06/01/15  Yes Venia Carbon, MD  Cholecalciferol (VITAMIN D3 PO) Take 1 tablet by mouth daily.   Yes [provider]  clobetasol ointment (TEMOVATE) 3.79 % Apply 1 application topically as needed. Patient taking differently: Apply 1  application topically 2 (two) times daily as needed (psoriasis).  06/01/15  Yes Venia Carbon, MD  fluticasone (FLONASE) 50 MCG/ACT nasal spray Place 2 sprays into the nose daily.   Yes Venia Carbon, MD  ibuprofen (ADVIL) 800 MG tablet Take 1 tablet (800 mg total) by mouth every 6 (six) hours as needed. Patient taking differently: Take 800 mg by mouth every 6 (six) hours as needed (pain/inflammation.).  04/29/19  Yes Venia Carbon, MD  levocetirizine (XYZAL) 5 MG tablet Take 5 mg by mouth daily.   Yes [provider]  metFORMIN (GLUCOPHAGE-XR) 500 MG 24 hr tablet Take 3 tablets (1,500 mg total) by mouth daily with breakfast. 10/29/19  Yes Venia Carbon, MD  olopatadine (PATANOL) 0.1 % ophthalmic solution Place 1 drop into both eyes 2 (two) times daily. Patient taking differently: Place 1 drop into both eyes daily.  04/24/18  Yes Venia Carbon, MD  pantoprazole (PROTONIX) 40 MG tablet Take 1 tablet (40 mg total) by mouth daily. 04/29/19  Yes Venia Carbon, MD  traMADol (ULTRAM) 50 MG tablet Take 1 tablet (50 mg total) by mouth 3 (three) times daily as needed. Patient taking differently: Take 50 mg by mouth 3 (three) times daily as needed (pain.).  10/28/19  Yes Venia Carbon, MD  triamterene-hydrochlorothiazide (MAXZIDE-25) 37.5-25 MG tablet Take 1 tablet by mouth daily. 08/20/19   Venia Carbon, MD  Turmeric (QC TUMERIC COMPLEX) 500 MG CAPS Take 500 mg by mouth in the morning, at noon, and at bedtime.    [provider]     Positive ROS: All other systems have been reviewed and were otherwise negative with the exception of those mentioned in the HPI and as above.  Physical Exam: General: Alert, no acute distress Cardiovascular: Regular rate and rhythm, no murmurs rubs or gallops.  No pedal edema Respiratory: Clear to auscultation bilaterally, no wheezes rales or rhonchi. No cyanosis, no use of accessory musculature GI: No organomegaly, abdomen is soft  and non-tender nondistended with positive bowel sounds. Skin: Skin intact, no lesions within the operative field. Neurologic: Sensation intact distally Psychiatric: Patient is competent for consent with normal mood and affect Lymphatic: No cervical lymphadenopathy  MUSCULOSKELETAL: Left hand:  Skin intact.  No significant swelling.  Limited ROM of left middle finger.  Decreased sensation to light touch of the index, middle and ring fingers.  Fingers well perfused.    Assessment: Left Carpal Tunnel Syndrome and Left Middle Trigger Finger  Plan: Plan for Procedure(s): Left CARPAL TUNNEL RELEASE Left Middle finger RELEASE TRIGGER FINGER/A-1 PULLEY  I reviewed the details of the operation and the post-op course with the patient.   I discussed the risks and benefits of surgery. The risks include but are  not limited to infection, bleeding, nerve, tendon or blood vessel injury, joint stiffness or loss of motion, persistent pain, weakness or instability, recurrent carpal tunnel syndrome and the need for further surgery. Medical risks include but are not limited to DVT and pulmonary embolism, myocardial infarction, stroke, pneumonia, respiratory failure and death. Patient understood these risks and wished to proceed.     Thornton Park, MD   01/28/2020 7:49 AM

## 2020-01-28 NOTE — Anesthesia Preprocedure Evaluation (Signed)
Anesthesia Evaluation  Patient identified by MRN, date of birth, ID band Patient awake    Reviewed: Allergy & Precautions, H&P , NPO status , Patient's Chart, lab work & pertinent test results  History of Anesthesia Complications (+) PONVNegative for: history of anesthetic complications  Airway Mallampati: III       Dental  (+) Teeth Intact, Dental Advidsory Given   Pulmonary neg shortness of breath, asthma , sleep apnea and Continuous Positive Airway Pressure Ventilation , neg recent URI,           Cardiovascular hypertension, (-) angina(-) Past MI and (-) Cardiac Stents + dysrhythmias (-) Valvular Problems/Murmurs     Neuro/Psych PSYCHIATRIC DISORDERS Anxiety negative neurological ROS     GI/Hepatic Neg liver ROS, GERD  Controlled,  Endo/Other  diabetes  Renal/GU Renal disease (stones)  negative genitourinary   Musculoskeletal   Abdominal   Peds  Hematology negative hematology ROS (+)   Anesthesia Other Findings Past Medical History: No date: Allergy No date: Anxiety No date: Dermatitis due to drugs and medicines taken internally(693.0) No date: GERD (gastroesophageal reflux disease) 6/16: Nephrolithiasis     Comment:  Passed kidney stone No date: Pure hyperglyceridemia No date: Unspecified sleep apnea  Past Surgical History: 10/07: US ECHOCARDIOGRAPHY     Comment:  normal, holter benign  No date: WISDOM TOOTH EXTRACTION     Reproductive/Obstetrics negative OB ROS                             Anesthesia Physical  Anesthesia Plan  ASA: II  Anesthesia Plan: General   Post-op Pain Management:    Induction: Intravenous  PONV Risk Score and Plan: 3 and Ondansetron, Dexamethasone, Midazolam, Promethazine and Treatment may vary due to age or medical condition  Airway Management Planned: LMA  Additional Equipment:   Intra-op Plan:   Post-operative Plan: Extubation in  OR  Informed Consent: I have reviewed the patients History and Physical, chart, labs and discussed the procedure including the risks, benefits and alternatives for the proposed anesthesia with the patient or authorized representative who has indicated his/her understanding and acceptance.     Dental Advisory Given  Plan Discussed with: Anesthesiologist and CRNA  Anesthesia Plan Comments:         Anesthesia Quick Evaluation

## 2020-01-28 NOTE — Anesthesia Postprocedure Evaluation (Signed)
Anesthesia Post Note  Patient: Joseph Jackson  Procedure(s) Performed: Left CARPAL TUNNEL RELEASE (Left ) Left middle finger RELEASE TRIGGER FINGER/A-1 PULLEY (Left Finger)  Patient location during evaluation: PACU Anesthesia Type: General Level of consciousness: awake and alert Pain management: pain level controlled Vital Signs Assessment: post-procedure vital signs reviewed and stable Respiratory status: spontaneous breathing, nonlabored ventilation, respiratory function stable and patient connected to nasal cannula oxygen Cardiovascular status: blood pressure returned to baseline and stable Postop Assessment: no apparent nausea or vomiting Anesthetic complications: no   No complications documented.   Last Vitals:  Vitals:   01/28/20 1035 01/28/20 1151  BP: (!) 156/81 127/66  Pulse: (!) 56 67  Resp: 16 14  Temp: (!) 36 C   SpO2: 97% 95%    Last Pain:  Vitals:   01/28/20 1151  TempSrc:   PainSc: 4                  Martha Clan

## 2020-02-02 ENCOUNTER — Encounter: Payer: Self-pay | Admitting: Internal Medicine

## 2020-02-02 ENCOUNTER — Other Ambulatory Visit: Payer: Self-pay

## 2020-02-02 ENCOUNTER — Ambulatory Visit (INDEPENDENT_AMBULATORY_CARE_PROVIDER_SITE_OTHER): Payer: 59 | Admitting: Internal Medicine

## 2020-02-02 VITALS — BP 130/86 | HR 76 | Temp 96.5°F | Ht 74.0 in | Wt 231.0 lb

## 2020-02-02 DIAGNOSIS — I10 Essential (primary) hypertension: Secondary | ICD-10-CM | POA: Diagnosis not present

## 2020-02-02 DIAGNOSIS — Z23 Encounter for immunization: Secondary | ICD-10-CM

## 2020-02-02 DIAGNOSIS — E1140 Type 2 diabetes mellitus with diabetic neuropathy, unspecified: Secondary | ICD-10-CM

## 2020-02-02 DIAGNOSIS — E1169 Type 2 diabetes mellitus with other specified complication: Secondary | ICD-10-CM | POA: Diagnosis not present

## 2020-02-02 DIAGNOSIS — E785 Hyperlipidemia, unspecified: Secondary | ICD-10-CM

## 2020-02-02 LAB — POCT GLYCOSYLATED HEMOGLOBIN (HGB A1C): Hemoglobin A1C: 7.4 % — AB (ref 4.0–5.6)

## 2020-02-02 MED ORDER — ROSUVASTATIN CALCIUM 10 MG PO TABS: 10.0000 mg | ORAL_TABLET | Freq: Every day | ORAL | 3 refills | Status: DC

## 2020-02-02 NOTE — Progress Notes (Signed)
Subjective:    Patient ID: Joseph Jackson, male    DOB: Jul 25, 1965, 54 y.o.   MRN: [id number]  HPI Here for follow up of diabetes This visit occurred during the SARS-CoV-2 public health emergency.  Safety protocols were in place, including screening questions prior to the visit, additional usage of staff PPE, and extensive cleaning of exam room while observing appropriate contact time as indicated for disinfecting solutions.   Diabetes is "all over the place" AM sugar can be 135--but then 203 this morning Now up to 152m metformin daily Is more conscious of carbs--and limiting Weight is down 9# in past 3 months  Current Outpatient Medications on File Prior to Visit  Medication Sig Dispense Refill  . calcipotriene (DOVONOX) 0.005 % cream Apply topically as needed. (Patient taking differently: Apply 1 application topically 2 (two) times daily as needed (psoriasis). ) 60 g 1  . Cholecalciferol (VITAMIN D3 PO) Take 1 tablet by mouth daily.    . clobetasol ointment (TEMOVATE) 08.84% Apply 1 application topically as needed. (Patient taking differently: Apply 1 application topically 2 (two) times daily as needed (psoriasis). ) 30 g 1  . fluticasone (FLONASE) 50 MCG/ACT nasal spray Place 2 sprays into the nose daily.    .Marland Kitchenlevocetirizine (XYZAL) 5 MG tablet Take 5 mg by mouth daily.    . metFORMIN (GLUCOPHAGE-XR) 500 MG 24 hr tablet Take 3 tablets (1,500 mg total) by mouth daily with breakfast. 270 tablet 3  . olopatadine (PATANOL) 0.1 % ophthalmic solution Place 1 drop into both eyes 2 (two) times daily. (Patient taking differently: Place 1 drop into both eyes daily. ) 5 mL 5  . pantoprazole (PROTONIX) 40 MG tablet Take 1 tablet (40 mg total) by mouth daily. 90 tablet 3  . triamterene-hydrochlorothiazide (MAXZIDE-25) 37.5-25 MG tablet Take 1 tablet by mouth daily. 90 tablet 3  . Turmeric (QC TUMERIC COMPLEX) 500 MG CAPS Take 500 mg by mouth in the morning, at noon, and at bedtime.     No  current facility-administered medications on file prior to visit.    Allergies  Allergen Reactions  . Aspirin Swelling    Bufferin lips, hives  . Other Other (See Comments)    Ragweed, pollen, pine/ nasal congestion and wheezing    Past Medical History:  Diagnosis Date  . Allergy   . Anxiety   . Asthma   . Bulging lumbar disc   . [place]   . Dermatitis due to drugs and medicines taken internally(693.0)   . Dysrhythmia   . GERD (gastroesophageal reflux disease)   . History of kidney stones   . Hypertension   . PONV (postoperative nausea and vomiting)   . Pure hyperglyceridemia   . Unspecified sleep apnea    CPAP    Past Surgical History:  Procedure Laterality Date  . CARPAL TUNNEL RELEASE Left 01/28/2020   Procedure: Left CARPAL TUNNEL RELEASE;  Surgeon: KThornton Park MD;  Location: [facility] ORS;  Service: Orthopedics;  Laterality: Left;  . COLONOSCOPY WITH PROPOFOL N/A 06/03/2018   Procedure: COLONOSCOPY WITH PROPOFOL;  Surgeon: AJonathon Bellows MD;  [facility];  Service: Gastroenterology;  Laterality: N/A;  . TRIGGER FINGER RELEASE Left 01/28/2020   Procedure: Left middle finger RELEASE TRIGGER FINGER/A-1 PULLEY;  Surgeon: KThornton Park MD;  Location: [facility] ORS;  Service: Orthopedics;  Laterality: Left;  . [place]ARDIOGRAPHY  10/07   normal, holter benign   . WISDOM TOOTH EXTRACTION  Family History  Problem Relation Age of Onset  . Cancer Mother   . Stroke Father   . Heart disease Father        MI due to aortic tear  . Spondylolysis Father   . Mental illness Sister   . Diabetes Neg Hx     Social History   Socioeconomic History  . Marital status: Married    Spouse name: Not on file  . Number of children: 2  . Years of education: Not on file  . Highest education level: Not on file  Occupational History  . 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 Smoking status: Never  Smoker  . Smokeless tobacco: Never Used  Vaping Use  . Vaping Use: Never used  Substance and Sexual Activity  . Alcohol use: Yes    Alcohol/week: 0.0 standard drinks    Comment: occasional  . Drug use: No  . Sexual activity: Not on file  Other Topics Concern  . Not on file  Social History Narrative  . Not on file   Social Determinants of Health   Financial Resource Strain:   . Difficulty of Paying Living Expenses: Not on file  Food Insecurity:   . Worried About Charity fundraiser in the Last Year: Not on file  . Ran Out of Food in the Last Year: Not on file  Transportation Needs:   . Lack of Transportation (Medical): Not on file  . Lack of Transportation (Non-Medical): Not on file  Physical Activity:   . Days of Exercise per Week: Not on file  . Minutes of Exercise per Session: Not on file  Stress:   . Feeling of Stress : Not on file  Social Connections:   . Frequency of Communication with Friends and Family: Not on file  . Frequency of Social Gatherings with Friends and Family: Not on file  . Attends Religious Services: Not on file  . Active Member of Clubs or Organizations: Not on file  . Attends Archivist Meetings: Not on file  . Marital Status: Not on file  Intimate Partner Violence:   . Fear of Current or Ex-Partner: Not on file  . Emotionally Abused: Not on file  . Physically Abused: Not on file  . Sexually Abused: Not on file   Review of Systems Had left carpal tunnel surgery recently--thinks it went okay Ongoing foot tingling---no overly painful No chest pain or SOB    Objective:   Physical Exam Constitutional:      Appearance: Normal appearance.  Cardiovascular:     Rate and Rhythm: Normal rate and regular rhythm.     Pulses: Normal pulses.     Heart sounds: No murmur heard.  No gallop.   Pulmonary:     Effort: Pulmonary effort is normal.     Breath sounds: Normal breath sounds. No wheezing or rales.  Musculoskeletal:     Right lower  leg: No edema.     Left lower leg: No edema.  Skin:    Comments: No foot lesions  Neurological:     Mental Status: He is alert.  Psychiatric:        Mood and Affect: Mood normal.        Behavior: Behavior normal.            Assessment & Plan:

## 2020-02-02 NOTE — Assessment & Plan Note (Signed)
BP Readings from Last 3 Encounters:  02/02/20 130/86  01/28/20 127/66  10/28/19 120/90   Fine on the HCTZ/triamterene No microalbuminuria

## 2020-02-02 NOTE — Addendum Note (Signed)
Addended by: Pilar Grammes on: 02/02/2020 09:57 AM   Modules accepted: Orders

## 2020-02-02 NOTE — Assessment & Plan Note (Signed)
Borderline elevated but given diabetes diagnosis---will start daily statin

## 2020-02-02 NOTE — Assessment & Plan Note (Addendum)
Control is better but still not great On increased metformin----weight down Will check A1c No major pain in feet---mostly tingling---no Rx for this  Lab Results  Component Value Date   HGBA1C 7.4 (A) 02/02/2020   Despite variability--it is now acceptable No medication changes (was going to add SGLT2 inhibitor)

## 2020-02-04 ENCOUNTER — Other Ambulatory Visit: Payer: Self-pay

## 2020-02-04 MED ORDER — TRIAMTERENE-HCTZ 37.5-25 MG PO TABS: 1.0000 | ORAL_TABLET | Freq: Every day | ORAL | 3 refills | Status: DC

## 2020-02-04 MED ORDER — METFORMIN HCL ER 500 MG PO TB24
1500.0000 mg | ORAL_TABLET | Freq: Every day | ORAL | 3 refills | Status: DC
Start: 2020-02-04 — End: 2020-09-26

## 2020-02-04 MED ORDER — PANTOPRAZOLE SODIUM 40 MG PO TBEC: 40.0000 mg | DELAYED_RELEASE_TABLET | Freq: Every day | ORAL | 3 refills | Status: DC

## 2020-02-05 ENCOUNTER — Other Ambulatory Visit: Payer: Self-pay | Admitting: Neurology

## 2020-02-05 DIAGNOSIS — M5416 Radiculopathy, lumbar region: Secondary | ICD-10-CM

## 2020-02-22 ENCOUNTER — Ambulatory Visit
Admission: RE | Admit: 2020-02-22 | Discharge: 2020-02-22 | Disposition: A | Discharge status: Home / Self Care | Payer: 59 | Source: Ambulatory Visit | Attending: Neurology | Admitting: Neurology

## 2020-02-22 ENCOUNTER — Other Ambulatory Visit: Payer: Self-pay

## 2020-02-22 DIAGNOSIS — M5416 Radiculopathy, lumbar region: Secondary | ICD-10-CM

## 2020-02-24 ENCOUNTER — Ambulatory Visit: Payer: 59

## 2020-04-29 ENCOUNTER — Ambulatory Visit (INDEPENDENT_AMBULATORY_CARE_PROVIDER_SITE_OTHER): Payer: 59 | Admitting: Internal Medicine

## 2020-04-29 ENCOUNTER — Other Ambulatory Visit: Payer: Self-pay

## 2020-04-29 ENCOUNTER — Encounter: Payer: Self-pay | Admitting: Internal Medicine

## 2020-04-29 VITALS — BP 124/84 | HR 67 | Temp 97.4°F | Ht 73.5 in | Wt 237.0 lb

## 2020-04-29 DIAGNOSIS — I10 Essential (primary) hypertension: Secondary | ICD-10-CM | POA: Diagnosis not present

## 2020-04-29 DIAGNOSIS — Z23 Encounter for immunization: Secondary | ICD-10-CM | POA: Diagnosis not present

## 2020-04-29 DIAGNOSIS — E1140 Type 2 diabetes mellitus with diabetic neuropathy, unspecified: Secondary | ICD-10-CM

## 2020-04-29 DIAGNOSIS — Z0000 Encounter for general adult medical examination without abnormal findings: Secondary | ICD-10-CM | POA: Diagnosis not present

## 2020-04-29 LAB — MICROALBUMIN / CREATININE URINE RATIO
Creatinine,U: 58.7 mg/dL
Microalb Creat Ratio: 1.2 mg/g (ref 0.0–30.0)
Microalb, Ur: <0.7 mg/dL (ref 0.0–1.9)

## 2020-04-29 LAB — LIPID PANEL
Cholesterol: 156 mg/dL (ref 0–200)
HDL: 39 mg/dL — ABNORMAL LOW (ref >39.00)
LDL Cholesterol: 80 mg/dL (ref 0–99)
NonHDL: 116.61
Total CHOL/HDL Ratio: 4
Triglycerides: 182 mg/dL — ABNORMAL HIGH (ref 0.0–149.0)
VLDL: 36.4 mg/dL (ref 0.0–40.0)

## 2020-04-29 LAB — COMPREHENSIVE METABOLIC PANEL
ALT: 25 U/L (ref 0–53)
AST: 17 U/L (ref 0–37)
Albumin: 5 g/dL (ref 3.5–5.2)
Alkaline Phosphatase: 52 U/L (ref 39–117)
BUN: 16 mg/dL (ref 6–23)
CO2: 28 mEq/L (ref 19–32)
Calcium: 10.2 mg/dL (ref 8.4–10.5)
Chloride: 100 mEq/L (ref 96–112)
Creatinine, Ser: 0.87 mg/dL (ref 0.40–1.50)
GFR: 96.98 mL/min (ref >60.00)
Glucose, Bld: 163 mg/dL — ABNORMAL HIGH (ref 70–99)
Potassium: 3.8 mEq/L (ref 3.5–5.1)
Sodium: 136 mEq/L (ref 135–145)
Total Bilirubin: 0.7 mg/dL (ref 0.2–1.2)
Total Protein: 7.5 g/dL (ref 6.0–8.3)

## 2020-04-29 LAB — HEMOGLOBIN A1C: Hgb A1c MFr Bld: 7.9 % — ABNORMAL HIGH (ref 4.6–6.5)

## 2020-04-29 LAB — CBC
HCT: 41.6 % (ref 39.0–52.0)
Hemoglobin: 14.5 g/dL (ref 13.0–17.0)
MCHC: 34.9 g/dL (ref 30.0–36.0)
MCV: 83.8 fl (ref 78.0–100.0)
Platelets: 203 10*3/uL (ref 150.0–400.0)
RBC: 4.97 Mil/uL (ref 4.22–5.81)
RDW: 13.2 % (ref 11.5–15.5)
WBC: 6.8 10*3/uL (ref 4.0–10.5)

## 2020-04-29 NOTE — Assessment & Plan Note (Addendum)
Some GI symptoms on metformin--- discussed low FODMAPs Consider farxiga/jardiance if not closer to 7% Takes gabapentin at night

## 2020-04-29 NOTE — Assessment & Plan Note (Signed)
BP Readings from Last 3 Encounters:  04/29/20 124/84  02/02/20 130/86  01/28/20 127/66   Okay on HCTZ/triamterene

## 2020-04-29 NOTE — Assessment & Plan Note (Signed)
Healthy Discussed exercise Shingrix #2 and pneumovax today Colon due 2025-2027 Discussed PSA--he will hold off Had COVID booster and flu vaccine

## 2020-04-29 NOTE — Progress Notes (Signed)
Subjective:    Patient ID: Joseph Jackson, male    DOB: February 26, 1966, 55 y.o.   MRN: [id number]  HPI Here for physical This visit occurred during the SARS-CoV-2 public health emergency.  Safety protocols were in place, including screening questions prior to the visit, additional usage of staff PPE, and extensive cleaning of exam room while observing appropriate contact time as indicated for disinfecting solutions.   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 calcipotriene (DOVONOX) 0.005 % cream Apply topically as needed. (Patient taking differently: Apply 1 application topically 2 (two) times daily as needed (psoriasis).) 60 g 1  . Cholecalciferol (VITAMIN D3 PO) Take 1 tablet by mouth daily.    . clobetasol ointment (TEMOVATE) 4.31 % Apply 1 application topically as needed. (Patient taking differently: Apply 1 application topically 2 (two) times daily as needed (psoriasis).) 30 g 1  . fluticasone (FLONASE) 50 MCG/ACT nasal spray Place 2 sprays into the nose daily.    Marland Kitchen gabapentin (NEURONTIN) 600 MG tablet Take 600 mg by mouth at bedtime.    Marland Kitchen levocetirizine (XYZAL) 5 MG tablet Take 5 mg by mouth daily.    . metFORMIN (GLUCOPHAGE-XR) 500 MG 24 hr tablet Take 3 tablets (1,500 mg total) by mouth daily with breakfast. 270 tablet 3  . olopatadine (PATANOL) 0.1 % ophthalmic solution Place 1 drop into both eyes 2 (two) times daily. (Patient taking differently: Place 1 drop into both eyes daily.) 5 mL 5  . pantoprazole (PROTONIX) 40 MG tablet Take 1 tablet  (40 mg total) by mouth daily. 90 tablet 3  . rosuvastatin (CRESTOR) 10 MG tablet Take 1 tablet (10 mg total) by mouth daily. 90 tablet 3  . triamterene-hydrochlorothiazide (MAXZIDE-25) 37.5-25 MG tablet Take 1 tablet by mouth daily. 90 tablet 3  . Turmeric 500 MG CAPS Take 500 mg by mouth in the morning, at noon, and at bedtime.     No current facility-administered medications on file prior to visit.    Allergies  Allergen Reactions  . Aspirin Swelling    Bufferin lips, hives  . Other Other (See Comments)    Ragweed, pollen, pine/ nasal congestion and wheezing    Past Medical History:  Diagnosis Date  . Allergy   . Anxiety   . Asthma   . Bulging lumbar disc   . Controlled type 2 diabetes mellitus with diabetic neuropathy ([place])   . Dermatitis due to drugs and medicines taken internally(693.0)   . Dysrhythmia   . GERD (gastroesophageal reflux disease)   . History of kidney stones   . Hypertension   . PONV (postoperative nausea and vomiting)   . Pure hyperglyceridemia   . Unspecified sleep apnea    CPAP    Past Surgical History:  Procedure Laterality Date  . CARPAL TUNNEL RELEASE Left 01/28/2020   Procedure: Left CARPAL TUNNEL RELEASE;  Surgeon: Thornton Park, MD;  Location: [facility] ORS;  Service: Orthopedics;  Laterality: Left;  . COLONOSCOPY WITH PROPOFOL N/A 06/03/2018   Procedure: COLONOSCOPY WITH PROPOFOL;  Surgeon: Jonathon Bellows, MD;  Location: [facility] ENDOSCOPY;  Service: Gastroenterology;  Laterality:  N/A;  . TRIGGER FINGER RELEASE Left 01/28/2020   Procedure: Left middle finger RELEASE TRIGGER FINGER/A-1 PULLEY;  Surgeon: Thornton Park, MD;  Location: [facility] ORS;  Service: Orthopedics;  Laterality: Left;  . US ECHOCARDIOGRAPHY  10/07   normal, holter benign   . WISDOM TOOTH EXTRACTION      Family History  Problem Relation Age of Onset  . Cancer Mother   . Stroke Father   . Heart disease Father        MI due to aortic tear  . Spondylolysis Father   . Mental  illness Sister   . Diabetes Neg Hx     Social History   Socioeconomic History  . Marital status: Married    Spouse name: Not on file  . Number of children: 2  . Years of education: Not on file  . Highest education level: Not on file  Occupational History  . 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 Smoking status: Never Smoker  . Smokeless tobacco: Never Used  Vaping Use  . Vaping Use: Never used  Substance and Sexual Activity  . Alcohol use: Yes    Alcohol/week: 0.0 standard drinks    Comment: occasional  . Drug use: No  . Sexual activity: Not on file  Other Topics Concern  . Not on file  Social History Narrative  . Not on file   Social Determinants of Health   Financial Resource Strain: Not on file  Food Insecurity: Not on file  Transportation Needs: Not on file  Physical Activity: Not on file  Stress: Not on file  Social Connections: Not on file  Intimate Partner Violence: Not on file   Review of Systems  Constitutional: Negative for fatigue and unexpected weight change.       Wears seat belt  HENT: Positive for tinnitus. Negative for dental problem.        Some hearing loss Keeps up with dentist  Eyes: Negative for visual disturbance.       No diplopia or unilateral vision loss  Respiratory: Negative for chest tightness and shortness of breath.        Occ allergy cough  Cardiovascular: Negative for chest pain, palpitations and leg swelling.  Gastrointestinal: Negative for blood in stool.       Some gas from metformin No diarrhea  Endocrine: Negative for polydipsia and polyuria.  Genitourinary: Negative for urgency.       Occasional slow stream Some ED  Musculoskeletal: Positive for back pain. Negative for arthralgias and joint swelling.       Back pain may be related to excessive time in car  Skin: Negative for rash.  Allergic/Immunologic: Positive for environmental allergies. Negative for immunocompromised state.       Seems to be all  year now  Neurological: Negative for dizziness, syncope and light-headedness.       Occ sinus headaches---also migraines from heat in summer  Hematological: Does not bruise/bleed easily.       Occ glands enlarged around ears with allergies  Psychiatric/Behavioral: Negative for dysphoric mood.       Sleeps with CPAP--occasionally wakes up if hosing hits face (nasal prongs) Situational stress---trying to sell house, etc. Wife is very anxious as well--this affects him Gets sense of detachment at times--but no overt depression       Objective:   Physical Exam Constitutional:      Appearance: Normal appearance.  HENT:     Right Ear: Tympanic membrane, ear canal and external ear  normal.     Left Ear: Tympanic membrane, ear canal and external ear normal.     Mouth/Throat:     Pharynx: No oropharyngeal exudate or posterior oropharyngeal erythema.  Eyes:     Conjunctiva/sclera: Conjunctivae normal.     Pupils: Pupils are equal, round, and reactive to light.  Cardiovascular:     Rate and Rhythm: Normal rate and regular rhythm.     Pulses: Normal pulses.     Heart sounds: No murmur heard. No gallop.   Pulmonary:     Effort: Pulmonary effort is normal.     Breath sounds: Normal breath sounds. No wheezing or rales.  Abdominal:     Palpations: Abdomen is soft.     Tenderness: There is no abdominal tenderness.  Musculoskeletal:     Cervical back: Neck supple.     Right lower leg: No edema.     Left lower leg: No edema.  Lymphadenopathy:     Cervical: No cervical adenopathy.  Skin:    General: Skin is warm.     Findings: No rash.     Comments: No foot lesions  Neurological:     General: No focal deficit present.     Mental Status: He is alert and oriented to person, place, and time.  Psychiatric:        Mood and Affect: Mood normal.        Behavior: Behavior normal.            Assessment & Plan:

## 2020-04-29 NOTE — Addendum Note (Signed)
Addended by: Pilar Grammes on: 04/29/2020 11:12 AM   Modules accepted: Orders

## 2020-05-05 MED ORDER — EMPAGLIFLOZIN 10 MG PO TABS: 10.0000 mg | ORAL_TABLET | Freq: Every day | ORAL | 3 refills | Status: DC

## 2020-05-18 ENCOUNTER — Other Ambulatory Visit: Payer: Self-pay | Admitting: Internal Medicine

## 2020-09-16 ENCOUNTER — Other Ambulatory Visit: Payer: Self-pay | Admitting: Internal Medicine

## 2020-09-26 MED ORDER — METFORMIN HCL ER 500 MG PO TB24
1500.0000 mg | ORAL_TABLET | Freq: Every day | ORAL | 1 refills | Status: DC
Start: 2020-09-26 — End: 2021-03-23

## 2020-09-26 NOTE — Telephone Encounter (Signed)
I do not see a problem with it as we saw him in January, but will clarify with Dr Silvio Pate 1st.

## 2020-12-16 ENCOUNTER — Telehealth: Payer: Self-pay

## 2020-12-16 DIAGNOSIS — E1140 Type 2 diabetes mellitus with diabetic neuropathy, unspecified: Secondary | ICD-10-CM

## 2020-12-16 MED ORDER — ONETOUCH ULTRASOFT LANCETS MISC: 0 refills | Status: AC

## 2020-12-16 MED ORDER — GLUCOSE BLOOD VI STRP: ORAL_STRIP | 0 refills | Status: DC

## 2020-12-16 NOTE — Telephone Encounter (Signed)
Rrequested glucose testing equipment was approved by Dr. Silvio Pate.

## 2020-12-18 ENCOUNTER — Other Ambulatory Visit: Payer: Self-pay | Admitting: Internal Medicine

## 2020-12-22 ENCOUNTER — Other Ambulatory Visit: Payer: Self-pay | Admitting: Internal Medicine

## 2021-02-09 ENCOUNTER — Encounter: Payer: Self-pay | Admitting: Internal Medicine

## 2021-03-08 ENCOUNTER — Other Ambulatory Visit: Payer: Self-pay | Admitting: Internal Medicine

## 2021-03-15 ENCOUNTER — Other Ambulatory Visit: Payer: Self-pay | Admitting: Internal Medicine

## 2021-03-15 DIAGNOSIS — E1140 Type 2 diabetes mellitus with diabetic neuropathy, unspecified: Secondary | ICD-10-CM

## 2021-03-16 ENCOUNTER — Encounter: Payer: Self-pay | Admitting: Internal Medicine

## 2021-03-18 ENCOUNTER — Other Ambulatory Visit: Payer: Self-pay | Admitting: Internal Medicine

## 2021-03-23 ENCOUNTER — Other Ambulatory Visit: Payer: Self-pay | Admitting: Internal Medicine

## 2021-03-30 ENCOUNTER — Other Ambulatory Visit: Payer: Self-pay | Admitting: Internal Medicine

## 2021-06-02 ENCOUNTER — Other Ambulatory Visit: Payer: Self-pay | Admitting: Internal Medicine

## 2021-06-11 ENCOUNTER — Other Ambulatory Visit: Payer: Self-pay | Admitting: Internal Medicine

## 2021-06-11 DIAGNOSIS — E1140 Type 2 diabetes mellitus with diabetic neuropathy, unspecified: Secondary | ICD-10-CM

## 2021-06-12 ENCOUNTER — Encounter: Payer: Self-pay | Admitting: Internal Medicine

## 2021-06-12 ENCOUNTER — Other Ambulatory Visit: Payer: Self-pay

## 2021-06-12 ENCOUNTER — Ambulatory Visit (INDEPENDENT_AMBULATORY_CARE_PROVIDER_SITE_OTHER): Payer: BC Managed Care – PPO | Admitting: Internal Medicine

## 2021-06-12 VITALS — BP 110/78 | HR 76 | Temp 97.7°F | Ht 74.0 in | Wt 226.0 lb

## 2021-06-12 DIAGNOSIS — E1140 Type 2 diabetes mellitus with diabetic neuropathy, unspecified: Secondary | ICD-10-CM

## 2021-06-12 DIAGNOSIS — I10 Essential (primary) hypertension: Secondary | ICD-10-CM | POA: Diagnosis not present

## 2021-06-12 DIAGNOSIS — G4733 Obstructive sleep apnea (adult) (pediatric): Secondary | ICD-10-CM | POA: Diagnosis not present

## 2021-06-12 DIAGNOSIS — J4520 Mild intermittent asthma, uncomplicated: Secondary | ICD-10-CM | POA: Diagnosis not present

## 2021-06-12 LAB — POCT GLYCOSYLATED HEMOGLOBIN (HGB A1C): Hemoglobin A1C: 6.9 % — AB (ref 4.0–5.6)

## 2021-06-12 LAB — HM DIABETES FOOT EXAM

## 2021-06-12 MED ORDER — ALBUTEROL SULFATE HFA 108 (90 BASE) MCG/ACT IN AERS: 2.0000 | INHALATION_SPRAY | Freq: Four times a day (QID) | RESPIRATORY_TRACT | 1 refills | Status: DC | PRN

## 2021-06-12 NOTE — Assessment & Plan Note (Addendum)
Has been using his CPAP nightly and it helps him sleep well

## 2021-06-12 NOTE — Assessment & Plan Note (Signed)
BP Readings from Last 3 Encounters:  06/12/21 110/78  04/29/20 124/84  02/02/20 130/86   Good control on triamterene/HCTZ 37.5/25

## 2021-06-12 NOTE — Assessment & Plan Note (Signed)
Much better now A1c down to 6.9% On jardiance 10, metformin 1500mg  daily---no changes Gabapentin 600mg  at bedtime for neuropathy

## 2021-06-12 NOTE — Assessment & Plan Note (Signed)
Uses xyzal daily Will give albuterol for prn use

## 2021-06-12 NOTE — Progress Notes (Signed)
Subjective:    Patient ID: Joseph Jackson, male    DOB: Jan 25, 1966, 56 y.o.   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 (Patient taking differently: Apply 1 application topically 2 (two) times daily as needed (psoriasis).) 60 g 1   Cholecalciferol (VITAMIN D3 PO) Take 1 tablet by mouth daily.     clobetasol ointment (TEMOVATE) 4.43 % Apply 1 application topically as needed. (Patient taking differently: Apply 1 application topically 2 (two) times daily as needed (psoriasis).) 30 g 1   fluticasone (FLONASE) 50 MCG/ACT nasal spray Place 2 sprays into the nose daily.     gabapentin (NEURONTIN) 600 MG tablet TAKE 1 TABLET BY MOUTH EVERYDAY AT BEDTIME 90 tablet 0   JARDIANCE 10 MG TABS tablet TAKE 1 TABLET BY MOUTH EVERY DAY 90 tablet 0   Lancets (ONETOUCH ULTRASOFT) lancets Use as instructed 100 each 0   levocetirizine (XYZAL) 5 MG tablet Take 5 mg by mouth daily.     metFORMIN (GLUCOPHAGE-XR) 500 MG 24 hr tablet TAKE 3 TABLETS (1,500 MG TOTAL) BY MOUTH DAILY WITH BREAKFAST. 270 tablet 0   olopatadine (PATANOL) 0.1 % ophthalmic solution Place 1 drop into both eyes 2 (two) times daily. (Patient taking differently: Place 1 drop into both eyes daily.) 5 mL 5   ONETOUCH ULTRA test  strip USE CHECK BLOOD SUGAR ONCE DAILY. 100 strip 0   pantoprazole (PROTONIX) 40 MG tablet TAKE 1 TABLET BY MOUTH EVERY DAY (Patient taking differently: 40 mg every other day.) 90 tablet 1   rosuvastatin (CRESTOR) 10 MG tablet TAKE 1 TABLET BY MOUTH EVERY DAY 90 tablet 0   triamterene-hydrochlorothiazide (MAXZIDE-25) 37.5-25 MG tablet TAKE 1 TABLET BY MOUTH EVERY DAY 90 tablet 0   Turmeric 500 MG CAPS Take 500 mg by mouth in the morning, at noon, and at bedtime.     No current facility-administered medications on file prior to visit.    Allergies  Allergen Reactions   Aspirin Swelling    Bufferin lips, hives   Other Other (See Comments)    Ragweed, pollen, pine/ nasal congestion and wheezing    Past Medical History:  Diagnosis Date   Allergy    Anxiety    Asthma    Bulging lumbar disc    Controlled type 2 diabetes mellitus with diabetic neuropathy (H[facility])    Dermatitis due to drugs and medicines taken internally(693.0)    Dysrhythmia    GERD (gastroesophageal reflux disease)    History of kidney stones    Hypertension    PONV (postoperative nausea and vomiting)    Pure hyperglyceridemia    Unspecified sleep apnea    CPAP    Past Surgical History:  Procedure Laterality Date   CARPAL TUNNEL RELEASE Left 01/28/2020   Procedure: Left CARPAL  TUNNEL RELEASE;  Surgeon: Thornton Park, MD;  Location: [facility] ORS;  Service: Orthopedics;  Laterality: Left;   COLONOSCOPY WITH PROPOFOL N/A 06/03/2018   Procedure: COLONOSCOPY WITH PROPOFOL;  Surgeon: Jonathon Bellows, MD;  Location: [facility] ENDOSCOPY;  Service: Gastroenterology;  Laterality: N/A;   TRIGGER FINGER RELEASE Left 01/28/2020   Procedure: Left middle finger RELEASE TRIGGER FINGER/A-1 PULLEY;  Surgeon: Thornton Park, MD;  Location: [facility] ORS;  Service: Orthopedics;  Laterality: Left;   US ECHOCARDIOGRAPHY  10/07   normal, holter benign    WISDOM TOOTH EXTRACTION      Family History  Problem Relation Age of Onset   Cancer Mother     Heart disease Father        MI due to aortic tear, CAD, valve repair   Stroke Father    Spondylolysis Father    Coronary artery disease Father        getting CABG with valve repair   Mental illness Sister    Diabetes Neg Hx     Social History   Socioeconomic History   Marital status: Married    Spouse name: Not on file   Number of children: 2   Years of education: Not on file   Highest education level: Not on file  Occupational History   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  Tobacco Use   Smoking status: Never    Passive exposure: Never   Smokeless tobacco: Never  Vaping Use   Vaping Use: Never used  Substance and Sexual Activity   Alcohol use: Yes    Alcohol/week: 0.0 standard drinks    Comment: occasional   Drug use: No   Sexual activity: Not on file  Other Topics Concern   Not on file  Social History Narrative   Not on file   Social Determinants of Health   Financial Resource Strain: Not on file  Food Insecurity: Not on file  Transportation Needs: Not on file  Physical Activity: Not on file  Stress: Not on file  Social Connections: Not on file  Intimate Partner Violence: Not on file   Review of Systems Uses the gabapentin at night--helps the hand numbness Had trigger release on left 3rd finger and CTS    Objective:   Physical Exam Constitutional:      Appearance: Normal appearance.  Cardiovascular:     Rate and Rhythm: Normal rate and regular rhythm.     Pulses: Normal pulses.     Heart sounds: No murmur heard.   No gallop.  Pulmonary:     Effort: Pulmonary effort is normal.     Breath sounds: Normal breath sounds. No wheezing or rales.  Musculoskeletal:     Cervical back: Neck supple.     Right lower leg: No edema.     Left lower leg: No edema.  Lymphadenopathy:     Cervical: No cervical adenopathy.  Skin:    Findings: No lesion or rash.     Comments: No foot lesions  Neurological:     Mental Status: He is  alert.     Comments: Decreased sensation in plantar feet           Assessment & Plan:

## 2021-06-27 ENCOUNTER — Other Ambulatory Visit: Payer: Self-pay | Admitting: Internal Medicine

## 2021-06-28 ENCOUNTER — Other Ambulatory Visit: Payer: Self-pay | Admitting: Internal Medicine

## 2021-08-14 LAB — HM DIABETES EYE EXAM

## 2021-09-05 ENCOUNTER — Encounter: Payer: Self-pay | Admitting: Internal Medicine

## 2021-09-05 ENCOUNTER — Other Ambulatory Visit: Payer: Self-pay | Admitting: Internal Medicine

## 2021-09-06 MED ORDER — TRIAMTERENE-HCTZ 37.5-25 MG PO TABS: 1.0000 | ORAL_TABLET | Freq: Every day | ORAL | 3 refills | Status: DC

## 2021-09-06 MED ORDER — METFORMIN HCL ER 500 MG PO TB24: 1500.0000 mg | ORAL_TABLET | Freq: Every day | ORAL | 3 refills | Status: DC

## 2021-09-21 ENCOUNTER — Encounter: Payer: Self-pay | Admitting: Internal Medicine

## 2021-11-30 ENCOUNTER — Other Ambulatory Visit: Payer: Self-pay | Admitting: Internal Medicine

## 2021-12-11 ENCOUNTER — Encounter: Payer: Self-pay | Admitting: Internal Medicine

## 2021-12-11 ENCOUNTER — Ambulatory Visit (INDEPENDENT_AMBULATORY_CARE_PROVIDER_SITE_OTHER): Payer: BC Managed Care – PPO | Admitting: Internal Medicine

## 2021-12-11 VITALS — BP 124/80 | HR 68 | Temp 98.6°F | Ht 73.25 in | Wt 228.0 lb

## 2021-12-11 DIAGNOSIS — Z125 Encounter for screening for malignant neoplasm of prostate: Secondary | ICD-10-CM | POA: Diagnosis not present

## 2021-12-11 DIAGNOSIS — Z0000 Encounter for general adult medical examination without abnormal findings: Secondary | ICD-10-CM | POA: Diagnosis not present

## 2021-12-11 DIAGNOSIS — G4733 Obstructive sleep apnea (adult) (pediatric): Secondary | ICD-10-CM

## 2021-12-11 DIAGNOSIS — I10 Essential (primary) hypertension: Secondary | ICD-10-CM

## 2021-12-11 DIAGNOSIS — E1140 Type 2 diabetes mellitus with diabetic neuropathy, unspecified: Secondary | ICD-10-CM

## 2021-12-11 DIAGNOSIS — K2100 Gastro-esophageal reflux disease with esophagitis, without bleeding: Secondary | ICD-10-CM | POA: Diagnosis not present

## 2021-12-11 LAB — HM DIABETES FOOT EXAM

## 2021-12-11 NOTE — Assessment & Plan Note (Signed)
Quiet on protonix every other day

## 2021-12-11 NOTE — Assessment & Plan Note (Signed)
Sleeps with CPAP every night

## 2021-12-11 NOTE — Assessment & Plan Note (Signed)
BP Readings from Last 3 Encounters:  12/11/21 124/80  06/12/21 110/78  04/29/20 124/84   Still good without meds Will start ARB if proteinuria

## 2021-12-11 NOTE — Assessment & Plan Note (Signed)
Healthy Colon due 2027 Discussed PSA---will check Needs to pick up on exercise Updated COVID and flu vaccines soon

## 2021-12-11 NOTE — Assessment & Plan Note (Signed)
Seems to be well controlled---metformin 1500 in morning, jardiance '10mg'$  Gabapentin 600 at bedtime---consider increase to '900mg'$ 

## 2021-12-11 NOTE — Progress Notes (Signed)
Subjective:    Patient ID: Joseph Jackson, male    DOB: 09/12/1965, 56 y.o.   MRN: [id number]  HPI Here for physical  Had to move from his rental--the well was condemned Now at a new house in [place] Working from home now--[employer] job  Not as active with the heat Plans to pick up with more walking  Checking sugars some 110-130 Gabapentin helps his hands more than his feet May want to increase the dose---burning Mostly at night--along with some cramping  Current Outpatient Medications on File Prior to Visit  Medication Sig Dispense Refill   albuterol (VENTOLIN HFA) 108 (90 Base) MCG/ACT inhaler Inhale 2 puffs into the lungs every 6 (six) hours as needed for wheezing or shortness of breath. 18 g 1   B Complex-Biotin-FA (B-COMPLEX PO) Take 1 tablet by mouth daily.     calcipotriene (DOVONOX) 0.005 % cream Apply topically as needed. (Patient taking differently: Apply 1 application  topically 2 (two) times daily as needed (psoriasis).) 60 g 1   Cholecalciferol (VITAMIN D-3) 125 MCG (5000 UT) TABS Take 1 tablet by mouth daily.     Chromium Picolinate 200 MCG TABS Take 1 tablet by mouth daily.     clobetasol ointment (TEMOVATE) 1.32 % Apply 1 application topically as needed. (Patient taking differently: Apply 1 application  topically 2 (two) times daily as needed (psoriasis).) 30 g 1   fluticasone (FLONASE) 50 MCG/ACT nasal spray Place 2 sprays into the nose daily.     gabapentin (NEURONTIN) 600 MG tablet TAKE 1 TABLET BY MOUTH EVERYDAY AT BEDTIME 90 tablet 0   JARDIANCE 10 MG TABS tablet TAKE 1 TABLET BY MOUTH EVERY DAY 90 tablet 3   Lancets (ONETOUCH ULTRASOFT) lancets Use as instructed 100 each 0   levocetirizine (XYZAL) 5 MG tablet Take 5 mg by mouth daily.     metFORMIN (GLUCOPHAGE-XR) 500 MG 24 hr tablet Take 3 tablets (1,500 mg total) by mouth daily with breakfast. 270 tablet 3   olopatadine (PATANOL) 0.1 % ophthalmic solution Place 1 drop into both eyes 2 (two) times  daily. (Patient taking differently: Place 1 drop into both eyes daily.) 5 mL 5   ONETOUCH ULTRA test strip USE CHECK BLOOD SUGAR ONCE DAILY. 100 strip 0   pantoprazole (PROTONIX) 40 MG tablet TAKE 1 TABLET BY MOUTH EVERY DAY (Patient taking differently: 40 mg every other day.) 90 tablet 1   rosuvastatin (CRESTOR) 10 MG tablet TAKE 1 TABLET BY MOUTH EVERY DAY 90 tablet 3   triamterene-hydrochlorothiazide (MAXZIDE-25) 37.5-25 MG tablet Take 1 tablet by mouth daily. 90 tablet 3   Turmeric 500 MG CAPS Take 500 mg by mouth in the morning, at noon, and at bedtime.     vitamin E 180 MG (400 UNITS) capsule Take 400 Units by mouth daily.     No current facility-administered medications on file prior to visit.    Allergies  Allergen Reactions   Aspirin Swelling    Bufferin lips, hives   Other Other (See Comments)    Ragweed, pollen, pine/ nasal congestion and wheezing    Past Medical History:  Diagnosis Date   Allergy    Anxiety    Asthma    Bulging lumbar disc    Controlled type 2 diabetes mellitus with diabetic neuropathy ([facility])    Dermatitis due to drugs and medicines taken internally(693.0)    Dysrhythmia    GERD (gastroesophageal reflux disease)    History of kidney stones  Hypertension    PONV (postoperative nausea and vomiting)    Pure hyperglyceridemia    Unspecified sleep apnea    CPAP    Past Surgical History:  Procedure Laterality Date   CARPAL TUNNEL RELEASE Left 01/28/2020   Procedure: Left CARPAL TUNNEL RELEASE;  Surgeon: Thornton Park, MD;  Location: [facility] ORS;  Service: Orthopedics;  Laterality: Left;   COLONOSCOPY WITH PROPOFOL N/A 06/03/2018   Procedure: COLONOSCOPY WITH PROPOFOL;  Surgeon: Jonathon Bellows, MD;  Location: [facility] ENDOSCOPY;  Service: Gastroenterology;  Laterality: N/A;   TRIGGER FINGER RELEASE Left 01/28/2020   Procedure: Left middle finger RELEASE TRIGGER FINGER/A-1 PULLEY;  Surgeon: Thornton Park, MD;  Location: [facility] ORS;  Service: Orthopedics;   Laterality: Left;   US ECHOCARDIOGRAPHY  10/07   normal, holter benign    WISDOM TOOTH EXTRACTION      Family History  Problem Relation Age of Onset   Cancer Mother    Heart disease Father        MI due to aortic tear, CAD, valve repair   Stroke Father    Spondylolysis Father    Coronary artery disease Father        getting CABG with valve repair   Mental illness Sister    Diabetes Neg Hx     Social History   Socioeconomic History   Marital status: Married    Spouse name: Not on file   Number of children: 2   Years of education: Not on file   Highest education level: Not on file  Occupational History   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  Tobacco Use   Smoking status: Never    Passive exposure: Never   Smokeless tobacco: Never  Vaping Use   Vaping Use: Never used  Substance and Sexual Activity   Alcohol use: Yes    Alcohol/week: 0.0 standard drinks of alcohol    Comment: occasional   Drug use: No   Sexual activity: Not on file  Other Topics Concern   Not on file  Social History Narrative   Not on file   Social Determinants of Health   Financial Resource Strain: Not on file  Food Insecurity: Not on file  Transportation Needs: Not on file  Physical Activity: Not on file  Stress: Not on file  Social Connections: Not on file  Intimate Partner Violence: Not on file   Review of Systems  Constitutional:  Negative for fatigue and unexpected weight change.       Wears seat belt  HENT:  Positive for tinnitus. Negative for dental problem and hearing loss.        Keeps up with dentist  Eyes:  Negative for visual disturbance.       No diplopia or unilateral vision loss  Respiratory:  Negative for chest tightness and shortness of breath.        Some allergy cough--uses the albuterol rarely (allergy season)  Cardiovascular:  Negative for chest pain, palpitations and leg swelling.  Gastrointestinal:  Negative for constipation.        Some blood from hemorrhoid--on toilet paper No heartburn with every other day pantoprazole  Endocrine: Negative for polydipsia and polyuria.  Genitourinary:  Positive for frequency. Negative for difficulty urinating and urgency.       No sexual problems  Musculoskeletal:  Negative for arthralgias and joint swelling.       Has tramadol for when back pain is bad (not as often now)  Skin:  Slight rash around lips/nose---lotion helps  Allergic/Immunologic: Positive for environmental allergies. Negative for immunocompromised state.       Xyzal works pretty well Flonase, eye drops also  Neurological:  Negative for dizziness, syncope and light-headedness.       Rare vertigo if ears are clogged with the allergies Migraines if he is overheated  Hematological:  Does not bruise/bleed easily.       Some nodes under jaw with the allergies  Psychiatric/Behavioral:  Negative for dysphoric mood and sleep disturbance. The patient is not nervous/anxious.        Objective:   Physical Exam Constitutional:      Appearance: Normal appearance.  HENT:     Mouth/Throat:     Pharynx: No oropharyngeal exudate or posterior oropharyngeal erythema.  Eyes:     Conjunctiva/sclera: Conjunctivae normal.     Pupils: Pupils are equal, round, and reactive to light.  Cardiovascular:     Rate and Rhythm: Normal rate and regular rhythm.     Pulses: Normal pulses.     Heart sounds: No murmur heard.    No gallop.  Pulmonary:     Effort: Pulmonary effort is normal.     Breath sounds: Normal breath sounds. No wheezing or rales.  Abdominal:     Palpations: Abdomen is soft.     Tenderness: There is no abdominal tenderness.  Musculoskeletal:     Cervical back: Neck supple.     Right lower leg: No edema.     Left lower leg: No edema.  Lymphadenopathy:     Cervical: No cervical adenopathy.  Skin:    Findings: No lesion or rash.     Comments: No foot lesions  Neurological:     General: No focal deficit  present.     Mental Status: He is alert and oriented to person, place, and time.     Comments: Decreased sensation in feet  Psychiatric:        Mood and Affect: Mood normal.        Behavior: Behavior normal.            Assessment & Plan:

## 2021-12-12 LAB — CBC
HCT: 47.2 % (ref 39.0–52.0)
Hemoglobin: 16.3 g/dL (ref 13.0–17.0)
MCHC: 34.5 g/dL (ref 30.0–36.0)
MCV: 85.8 fl (ref 78.0–100.0)
Platelets: 209 10*3/uL (ref 150.0–400.0)
RBC: 5.51 Mil/uL (ref 4.22–5.81)
RDW: 13 % (ref 11.5–15.5)
WBC: 9.2 10*3/uL (ref 4.0–10.5)

## 2021-12-12 LAB — COMPREHENSIVE METABOLIC PANEL
ALT: 22 U/L (ref 0–53)
AST: 19 U/L (ref 0–37)
Albumin: 5.1 g/dL (ref 3.5–5.2)
Alkaline Phosphatase: 55 U/L (ref 39–117)
BUN: 23 mg/dL (ref 6–23)
CO2: 25 mEq/L (ref 19–32)
Calcium: 10.6 mg/dL — ABNORMAL HIGH (ref 8.4–10.5)
Chloride: 100 mEq/L (ref 96–112)
Creatinine, Ser: 1.03 mg/dL (ref 0.40–1.50)
GFR: 80.72 mL/min (ref >60.00)
Glucose, Bld: 114 mg/dL — ABNORMAL HIGH (ref 70–99)
Potassium: 4.1 mEq/L (ref 3.5–5.1)
Sodium: 139 mEq/L (ref 135–145)
Total Bilirubin: 0.6 mg/dL (ref 0.2–1.2)
Total Protein: 7.8 g/dL (ref 6.0–8.3)

## 2021-12-12 LAB — LIPID PANEL
Cholesterol: 226 mg/dL — ABNORMAL HIGH (ref 0–200)
HDL: 36.6 mg/dL — ABNORMAL LOW (ref >39.00)
Total CHOL/HDL Ratio: 6
Triglycerides: 699 mg/dL — ABNORMAL HIGH (ref 0.0–149.0)

## 2021-12-12 LAB — MICROALBUMIN / CREATININE URINE RATIO
Creatinine,U: 117.5 mg/dL
Microalb Creat Ratio: 3.6 mg/g (ref 0.0–30.0)
Microalb, Ur: 4.2 mg/dL — ABNORMAL HIGH (ref 0.0–1.9)

## 2021-12-12 LAB — PSA: PSA: 0.57 ng/mL (ref 0.10–4.00)

## 2021-12-12 LAB — LDL CHOLESTEROL, DIRECT: Direct LDL: 110 mg/dL

## 2021-12-12 LAB — HEMOGLOBIN A1C: Hgb A1c MFr Bld: 7.4 % — ABNORMAL HIGH (ref 4.6–6.5)

## 2021-12-23 ENCOUNTER — Encounter: Payer: Self-pay | Admitting: Internal Medicine

## 2021-12-25 MED ORDER — GABAPENTIN (ONCE-DAILY) 300 MG PO TABS
900.0000 mg | ORAL_TABLET | Freq: Every day | ORAL | 3 refills | Status: DC
Start: 2021-12-25 — End: 2022-07-11

## 2021-12-29 ENCOUNTER — Other Ambulatory Visit: Payer: Self-pay | Admitting: Internal Medicine

## 2022-01-26 ENCOUNTER — Encounter: Payer: Self-pay | Admitting: Internal Medicine

## 2022-01-26 NOTE — Telephone Encounter (Signed)
Called patient he has swollen gland on left side of neck that came up after immunizations. States it is tender but not bad. Has not changed much in size or symptoms. Have scheduled appointment for patient for in the office with Dr. Silvio Pate on Monday. Patient aware that if changes in size, has fever or any other red words over the weekend to go to ED or urgent care

## 2022-01-29 ENCOUNTER — Ambulatory Visit: Payer: BC Managed Care – PPO | Admitting: Internal Medicine

## 2022-03-01 ENCOUNTER — Encounter: Payer: Self-pay | Admitting: Internal Medicine

## 2022-03-23 ENCOUNTER — Other Ambulatory Visit: Payer: Self-pay | Admitting: Internal Medicine

## 2022-03-23 DIAGNOSIS — E1140 Type 2 diabetes mellitus with diabetic neuropathy, unspecified: Secondary | ICD-10-CM

## 2022-04-20 ENCOUNTER — Encounter: Payer: Self-pay | Admitting: Internal Medicine

## 2022-06-14 ENCOUNTER — Ambulatory Visit (INDEPENDENT_AMBULATORY_CARE_PROVIDER_SITE_OTHER): Payer: Medicaid Other | Admitting: Internal Medicine

## 2022-06-14 ENCOUNTER — Encounter: Payer: Self-pay | Admitting: Internal Medicine

## 2022-06-14 VITALS — BP 122/80 | HR 79 | Temp 97.5°F | Ht 73.25 in | Wt 223.0 lb

## 2022-06-14 DIAGNOSIS — E1140 Type 2 diabetes mellitus with diabetic neuropathy, unspecified: Secondary | ICD-10-CM | POA: Diagnosis not present

## 2022-06-14 LAB — POCT GLYCOSYLATED HEMOGLOBIN (HGB A1C): Hemoglobin A1C: 7 % — AB (ref 4.0–5.6)

## 2022-06-14 NOTE — Progress Notes (Signed)
Subjective:    Patient ID: Joseph Jackson, male    DOB: 1965/11/12, 57 y.o.   MRN: [id number]  HPI Here for follow up of diabetes  Still in [place]---bought a house and moved in there Working from home  Doing okay with lifestyle Will "treat myself" once a week---otherwise he is pretty good Sugars usually 115-125 fasting  Neuropathy has stabilized---using the gabapentin, now 949m  (keeps the hand numbness and feet better) Also taking the alpha lipoic acid  Found trail near house---now hiking more No chest pain or SOB Long time since the allergic asthma--can trigger with cold (then uses the albuterol)  Current Outpatient Medications on File Prior to Visit  Medication Sig Dispense Refill   albuterol (VENTOLIN HFA) 108 (90 Base) MCG/ACT inhaler TAKE 2 PUFFS BY MOUTH EVERY 6 HOURS AS NEEDED FOR WHEEZE OR SHORTNESS OF BREATH 8.5 each 1   Alpha-Lipoic Acid 600 MG TABS Take 2 tablets by mouth daily at 2 PM.     B Complex-Biotin-FA (B-COMPLEX PO) Take 1 tablet by mouth daily.     calcipotriene (DOVONOX) 0.005 % cream Apply topically as needed. (Patient taking differently: Apply 1 application  topically 2 (two) times daily as needed (psoriasis).) 60 g 1   Cholecalciferol (VITAMIN D-3) 125 MCG (5000 UT) TABS Take 1 tablet by mouth once a week.     Chromium Picolinate 200 MCG TABS Take 1 tablet by mouth daily.     clobetasol ointment (TEMOVATE) 0AB-123456789% Apply 1 application topically as needed. (Patient taking differently: Apply 1 application  topically 2 (two) times daily as needed (psoriasis).) 30 g 1   Coenzyme Q10 (CO Q10) 200 MG CAPS Take by mouth.     fluticasone (FLONASE) 50 MCG/ACT nasal spray Place 2 sprays into the nose daily.     Gabapentin, Once-Daily, 300 MG TABS Take 900 mg by mouth at bedtime. 270 tablet 3   glucose blood (ONETOUCH ULTRA) test strip USE CHECK BLOOD SUGAR ONCE DAILY. 100 strip 3   JARDIANCE 10 MG TABS tablet TAKE 1 TABLET BY MOUTH EVERY DAY 90 tablet 3    Lancets (ONETOUCH ULTRASOFT) lancets Use as instructed 100 each 0   levocetirizine (XYZAL) 5 MG tablet Take 5 mg by mouth daily.     metFORMIN (GLUCOPHAGE-XR) 500 MG 24 hr tablet Take 3 tablets (1,500 mg total) by mouth daily with breakfast. 270 tablet 3   olopatadine (PATANOL) 0.1 % ophthalmic solution Place 1 drop into both eyes 2 (two) times daily. (Patient taking differently: Place 1 drop into both eyes daily.) 5 mL 5   Omega-3 Fatty Acids (FISH OIL) 1200 MG CAPS Take 2 capsules by mouth at bedtime.     pantoprazole (PROTONIX) 40 MG tablet TAKE 1 TABLET BY MOUTH EVERY DAY (Patient taking differently: 40 mg every other day.) 90 tablet 1   rosuvastatin (CRESTOR) 10 MG tablet TAKE 1 TABLET BY MOUTH EVERY DAY 90 tablet 3   triamterene-hydrochlorothiazide (MAXZIDE-25) 37.5-25 MG tablet Take 1 tablet by mouth daily. 90 tablet 3   vitamin E 180 MG (400 UNITS) capsule Take 400 Units by mouth daily.     No current facility-administered medications on file prior to visit.    Allergies  Allergen Reactions   Aspirin Swelling    Bufferin lips, hives   Other Other (See Comments)    Ragweed, pollen, pine/ nasal congestion and wheezing    Past Medical History:  Diagnosis Date   Allergy    Anxiety  Asthma    Bulging lumbar disc    Controlled type 2 diabetes mellitus with diabetic neuropathy ([place])    Dermatitis due to drugs and medicines taken internally(693.0)    Dysrhythmia    GERD (gastroesophageal reflux disease)    History of kidney stones    Hypertension    PONV (postoperative nausea and vomiting)    Pure hyperglyceridemia    Unspecified sleep apnea    CPAP    Past Surgical History:  Procedure Laterality Date   CARPAL TUNNEL RELEASE Left 01/28/2020   Procedure: Left CARPAL TUNNEL RELEASE;  Surgeon: Thornton Park, MD;  Location: [facility] ORS;  Service: Orthopedics;  Laterality: Left;   COLONOSCOPY WITH PROPOFOL N/A 06/03/2018   Procedure: COLONOSCOPY WITH PROPOFOL;  Surgeon: Jonathon Bellows, MD;  Location: [facility] ENDOSCOPY;  Service: Gastroenterology;  Laterality: N/A;   TRIGGER FINGER RELEASE Left 01/28/2020   Procedure: Left middle finger RELEASE TRIGGER FINGER/A-1 PULLEY;  Surgeon: Thornton Park, MD;  Location: [facility] ORS;  Service: Orthopedics;  Laterality: Left;   US ECHOCARDIOGRAPHY  10/07   normal, holter benign    WISDOM TOOTH EXTRACTION      Family History  Problem Relation Age of Onset   Cancer Mother    Heart disease Father        MI due to aortic tear, CAD, valve repair   Stroke Father    Spondylolysis Father    Coronary artery disease Father        getting CABG with valve repair   Mental illness Sister    Diabetes Neg Hx     Social History   Socioeconomic History   Marital status: Married    Spouse name: Not on file   Number of children: 2   Years of education: Not on file   Highest education level: Not on file  Occupational History   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  Tobacco Use   Smoking status: Never    Passive exposure: Never   Smokeless tobacco: Never  Vaping Use   Vaping Use: Never used  Substance and Sexual Activity   Alcohol use: Yes    Alcohol/week: 0.0 standard drinks of alcohol    Comment: occasional   Drug use: No   Sexual activity: Not on file  Other Topics Concern   Not on file  Social History Narrative   Not on file   Social Determinants of Health   Financial Resource Strain: Not on file  Food Insecurity: Not on file  Transportation Needs: Not on file  Physical Activity: Not on file  Stress: Not on file  Social Connections: Not on file  Intimate Partner Violence: Not on file   Review of Systems Weight down slightly Sleeping fine    Objective:   Physical Exam Constitutional:      Appearance: Normal appearance.  Cardiovascular:     Rate and Rhythm: Normal rate and regular rhythm.     Pulses: Normal pulses.     Heart sounds: No murmur heard.    No gallop.  Pulmonary:      Effort: Pulmonary effort is normal.     Breath sounds: Normal breath sounds. No wheezing or rales.  Musculoskeletal:     Cervical back: Neck supple.     Right lower leg: No edema.     Left lower leg: No edema.  Lymphadenopathy:     Cervical: No cervical adenopathy.  Skin:    Comments: No foot lesions  Neurological:  Mental Status: He is alert.            Assessment & Plan:

## 2022-06-14 NOTE — Assessment & Plan Note (Signed)
Lab Results  Component Value Date   HGBA1C 7.0 (A) 06/14/2022   Control improved  Jardiance 10, metformin 1500 mg daily  Gabapentin 900 bedtime and alpha lipoic acid

## 2022-07-07 ENCOUNTER — Encounter: Payer: Self-pay | Admitting: Internal Medicine

## 2022-07-10 ENCOUNTER — Other Ambulatory Visit: Payer: Self-pay | Admitting: Internal Medicine

## 2022-07-10 NOTE — Telephone Encounter (Signed)
  Pharmacy comment: Alternative Requested:PLEASE SWITCH TO REGULAR GABAPENTIN AS INSURANCE NO LONGER COVERS ER.

## 2022-09-09 ENCOUNTER — Other Ambulatory Visit: Payer: Self-pay | Admitting: Internal Medicine

## 2022-09-18 ENCOUNTER — Other Ambulatory Visit: Payer: Self-pay | Admitting: Internal Medicine

## 2022-10-05 LAB — HM DIABETES EYE EXAM

## 2022-10-29 ENCOUNTER — Encounter: Payer: Self-pay | Admitting: Internal Medicine

## 2022-10-29 ENCOUNTER — Telehealth: Payer: 59 | Admitting: Nurse Practitioner

## 2022-10-29 DIAGNOSIS — U071 COVID-19: Secondary | ICD-10-CM | POA: Diagnosis not present

## 2022-10-29 MED ORDER — NIRMATRELVIR/RITONAVIR (PAXLOVID)TABLET: 3.0000 | ORAL_TABLET | Freq: Two times a day (BID) | ORAL | 0 refills | Status: AC

## 2022-10-29 NOTE — Patient Instructions (Signed)
Hold Crestor for 5 days while on anti-viral

## 2022-10-29 NOTE — Progress Notes (Signed)
. Virtual Visit Consent   Joseph Jackson, you are scheduled for a virtual visit with a [facility] provider today. Just as with appointments in the office, your consent must be obtained to participate. Your consent will be active for this visit and any virtual visit you may have with one of our providers in the next 365 days. If you have a MyChart account, a copy of this consent can be sent to you electronically.  As this is a virtual visit, video technology does not allow for your provider to perform a traditional examination. This may limit your provider's ability to fully assess your condition. If your provider identifies any concerns that need to be evaluated in person or the need to arrange testing (such as labs, EKG, etc.), we will make arrangements to do so. Although advances in technology are sophisticated, we cannot ensure that it will always work on either your end or our end. If the connection with a video visit is poor, the visit may have to be switched to a telephone visit. With either a video or telephone visit, we are not always able to ensure that we have a secure connection.  By engaging in this virtual visit, you consent to the provision of healthcare and authorize for your insurance to be billed (if applicable) for the services provided during this visit. Depending on your insurance coverage, you may receive a charge related to this service.  I need to obtain your verbal consent now. Are you willing to proceed with your visit today? [name] has provided verbal consent on 10/29/2022 for a virtual visit (video or telephone). Viviano Simas, FNP  Date: 10/29/2022 2:21 PM  Virtual Visit via Video Note   I, Viviano Simas, connected with  HENRIK Jackson  ([id number], 1966/03/27) on 10/29/22 at  2:30 PM EDT by a video-enabled telemedicine application and verified that I am speaking with the correct person using two identifiers.  Location: Patient: Virtual Visit Location Patient:  Home Provider: Virtual Visit Location Provider: Home Office   I discussed the limitations of evaluation and management by telemedicine and the availability of in person appointments. The patient expressed understanding and agreed to proceed.    History of Present Illness: Joseph Jackson is a 57 y.o. who identifies as a male who was assigned male at birth, and is being seen today for COVID.   He just took a test at [facility] today and is positive  Symptoms onset was last night   Symptoms today include: Sore throat, sinus irritation, cough, sneezing, muscle aches and pains  Feels flushed without a fever  98.2  He has not had COVID in the past  He has been vaccinated x6 for COVID including recent booster   Treated for HTN and DM He did have asthma when he was a teenager, no recent exacerbations  He has had bronchitis in the past  No history of pneumonia   He has NSAID intolerance  ASA seems to bother him the most   Denies a history of kidney disease  GFR from August was 80   Problems:  Patient Active Problem List   Diagnosis Date Noted   Hyperlipidemia associated with type 2 diabetes mellitus (HCC) 02/02/2020   Hypertension    Controlled type 2 diabetes mellitus with diabetic neuropathy (HCC)    Chronic back pain 04/24/2018   Preventative health care 06/01/2015   Nephrolithiasis    OSA (obstructive sleep apnea) 05/27/2014   Sensory loss 01/07/2012  ANXIETY 11/28/2006   Allergic asthma 11/28/2006   GERD 07/25/2006    Allergies:  Allergies  Allergen Reactions   Aspirin Swelling    Bufferin lips, hives   Other Other (See Comments)    Ragweed, pollen, pine/ nasal congestion and wheezing   Medications:  Current Outpatient Medications:    albuterol (VENTOLIN HFA) 108 (90 Base) MCG/ACT inhaler, TAKE 2 PUFFS BY MOUTH EVERY 6 HOURS AS NEEDED FOR WHEEZE OR SHORTNESS OF BREATH, Disp: 8.5 each, Rfl: 1   Alpha-Lipoic Acid 600 MG TABS, Take 2 tablets by mouth daily at 2 PM.,  Disp: , Rfl:    B Complex-Biotin-FA (B-COMPLEX PO), Take 1 tablet by mouth daily., Disp: , Rfl:    calcipotriene (DOVONOX) 0.005 % cream, Apply topically as needed. (Patient taking differently: Apply 1 application  topically 2 (two) times daily as needed (psoriasis).), Disp: 60 g, Rfl: 1   Cholecalciferol (VITAMIN D-3) 125 MCG (5000 UT) TABS, Take 1 tablet by mouth once a week., Disp: , Rfl:    Chromium Picolinate 200 MCG TABS, Take 1 tablet by mouth daily., Disp: , Rfl:    clobetasol ointment (TEMOVATE) 0.05 %, Apply 1 application topically as needed. (Patient taking differently: Apply 1 application  topically 2 (two) times daily as needed (psoriasis).), Disp: 30 g, Rfl: 1   Coenzyme Q10 (CO Q10) 200 MG CAPS, Take by mouth., Disp: , Rfl:    fluticasone (FLONASE) 50 MCG/ACT nasal spray, Place 2 sprays into the nose daily., Disp: , Rfl:    gabapentin (NEURONTIN) 300 MG capsule, Take 1 capsule (300 mg total) by mouth 3 (three) times daily., Disp: 90 capsule, Rfl: 11   glucose blood (ONETOUCH ULTRA) test strip, USE CHECK BLOOD SUGAR ONCE DAILY., Disp: 100 strip, Rfl: 3   JARDIANCE 10 MG TABS tablet, TAKE 1 TABLET BY MOUTH EVERY DAY, Disp: 90 tablet, Rfl: 3   Lancets (ONETOUCH ULTRASOFT) lancets, Use as instructed, Disp: 100 each, Rfl: 0   levocetirizine (XYZAL) 5 MG tablet, Take 5 mg by mouth daily., Disp: , Rfl:    metFORMIN (GLUCOPHAGE-XR) 500 MG 24 hr tablet, TAKE 3 TABLETS (1,500 MG TOTAL) BY MOUTH DAILY WITH BREAKFAST, Disp: 270 tablet, Rfl: 0   olopatadine (PATANOL) 0.1 % ophthalmic solution, Place 1 drop into both eyes 2 (two) times daily. (Patient taking differently: Place 1 drop into both eyes daily.), Disp: 5 mL, Rfl: 5   Omega-3 Fatty Acids (FISH OIL) 1200 MG CAPS, Take 2 capsules by mouth at bedtime., Disp: , Rfl:    pantoprazole (PROTONIX) 40 MG tablet, TAKE 1 TABLET BY MOUTH EVERY DAY (Patient taking differently: 40 mg every other day.), Disp: 90 tablet, Rfl: 1   rosuvastatin (CRESTOR) 10  MG tablet, TAKE 1 TABLET BY MOUTH EVERY DAY, Disp: 90 tablet, Rfl: 3   triamterene-hydrochlorothiazide (MAXZIDE-25) 37.5-25 MG tablet, TAKE 1 TABLET BY MOUTH EVERY DAY, Disp: 90 tablet, Rfl: 1   vitamin E 180 MG (400 UNITS) capsule, Take 400 Units by mouth daily., Disp: , Rfl:   Observations/Objective: Patient is well-developed, well-nourished in no acute distress.  Resting comfortably  at home.  Head is normocephalic, atraumatic.  No labored breathing.  Speech is clear and coherent with logical content.  Patient is alert and oriented at baseline.    Assessment and Plan: 1. COVID-19  Hold Crestor while one anti-viral   Meds ordered this encounter  Medications   nirmatrelvir/ritonavir (PAXLOVID) 20 x 150 MG & 10 x 100MG  TABS    Sig: Take 3  tablets by mouth 2 (two) times daily for 5 days. (Take nirmatrelvir 150 mg two tablets twice daily for 5 days and ritonavir 100 mg one tablet twice daily for 5 days) Patient GFR is 80    Dispense:  30 tablet    Refill:  0      May use Coricidin HBP, flonase for added support    Follow Up Instructions: I discussed the assessment and treatment plan with the patient. The patient was provided an opportunity to ask questions and all were answered. The patient agreed with the plan and demonstrated an understanding of the instructions.  A copy of instructions were sent to the patient via MyChart unless otherwise noted below.    The patient was advised to call back or seek an in-person evaluation if the symptoms worsen or if the condition fails to improve as anticipated.  Time:  I spent 15 minutes with the patient via telehealth technology discussing the above problems/concerns.    Viviano Simas, FNP

## 2022-11-13 ENCOUNTER — Encounter: Payer: Self-pay | Admitting: Internal Medicine

## 2022-11-13 MED ORDER — CEPHALEXIN 500 MG PO CAPS: 500.0000 mg | ORAL_CAPSULE | Freq: Three times a day (TID) | ORAL | 0 refills | Status: DC

## 2022-11-14 ENCOUNTER — Encounter (INDEPENDENT_AMBULATORY_CARE_PROVIDER_SITE_OTHER): Payer: Self-pay

## 2022-11-21 ENCOUNTER — Encounter: Payer: Self-pay | Admitting: Internal Medicine

## 2022-11-21 ENCOUNTER — Ambulatory Visit (INDEPENDENT_AMBULATORY_CARE_PROVIDER_SITE_OTHER): Payer: 59 | Admitting: Internal Medicine

## 2022-11-21 VITALS — BP 112/84 | HR 65 | Temp 97.3°F | Ht 71.25 in | Wt 223.0 lb

## 2022-11-21 DIAGNOSIS — L03116 Cellulitis of left lower limb: Secondary | ICD-10-CM | POA: Diagnosis not present

## 2022-11-21 NOTE — Assessment & Plan Note (Signed)
Seems to be better Some redness on the concerning left foot--but seems to be new skin from likely superficial abrasion He will monitor---if worsens, will try another course with the cephalexin

## 2022-11-21 NOTE — Progress Notes (Signed)
Subjective:    Patient ID: Joseph Jackson, male    DOB: 11-22-1965, 57 y.o.   MRN: [id number]  HPI Here to recheck infections on feet  He sent me email with pictures of damage to both great toes--7/30 I sent an antibiotic since the left one looked infected  Was crawling around pulling out a dishwasher--no shoes at the time Took the cephalexin Looks better now Some mild redness  No fever  Current Outpatient Medications on File Prior to Visit  Medication Sig Dispense Refill   albuterol (VENTOLIN HFA) 108 (90 Base) MCG/ACT inhaler TAKE 2 PUFFS BY MOUTH EVERY 6 HOURS AS NEEDED FOR WHEEZE OR SHORTNESS OF BREATH 8.5 each 1   Alpha-Lipoic Acid 600 MG TABS Take 2 tablets by mouth daily at 2 PM.     B Complex-Biotin-FA (B-COMPLEX PO) Take 1 tablet by mouth daily.     calcipotriene (DOVONOX) 0.005 % cream Apply topically as needed. (Patient taking differently: Apply 1 application  topically 2 (two) times daily as needed (psoriasis).) 60 g 1   Cholecalciferol (VITAMIN D-3) 125 MCG (5000 UT) TABS Take 1 tablet by mouth once a week.     Chromium Picolinate 200 MCG TABS Take 1 tablet by mouth daily.     clobetasol ointment (TEMOVATE) 0.05 % Apply 1 application topically as needed. (Patient taking differently: Apply 1 application  topically 2 (two) times daily as needed (psoriasis).) 30 g 1   Coenzyme Q10 (CO Q10) 200 MG CAPS Take by mouth.     fluticasone (FLONASE) 50 MCG/ACT nasal spray Place 2 sprays into the nose daily.     gabapentin (NEURONTIN) 300 MG capsule Take 1 capsule (300 mg total) by mouth 3 (three) times daily. 90 capsule 11   glucose blood (ONETOUCH ULTRA) test strip USE CHECK BLOOD SUGAR ONCE DAILY. 100 strip 3   JARDIANCE 10 MG TABS tablet TAKE 1 TABLET BY MOUTH EVERY DAY 90 tablet 3   Lancets (ONETOUCH ULTRASOFT) lancets Use as instructed 100 each 0   levocetirizine (XYZAL) 5 MG tablet Take 5 mg by mouth daily.     metFORMIN (GLUCOPHAGE-XR) 500 MG 24 hr tablet TAKE 3 TABLETS  (1,500 MG TOTAL) BY MOUTH DAILY WITH BREAKFAST 270 tablet 0   olopatadine (PATANOL) 0.1 % ophthalmic solution Place 1 drop into both eyes 2 (two) times daily. (Patient taking differently: Place 1 drop into both eyes daily.) 5 mL 5   Omega-3 Fatty Acids (FISH OIL) 1200 MG CAPS Take 2 capsules by mouth at bedtime.     pantoprazole (PROTONIX) 40 MG tablet TAKE 1 TABLET BY MOUTH EVERY DAY (Patient taking differently: 40 mg every other day.) 90 tablet 1   rosuvastatin (CRESTOR) 10 MG tablet TAKE 1 TABLET BY MOUTH EVERY DAY 90 tablet 3   triamterene-hydrochlorothiazide (MAXZIDE-25) 37.5-25 MG tablet TAKE 1 TABLET BY MOUTH EVERY DAY 90 tablet 1   vitamin E 180 MG (400 UNITS) capsule Take 400 Units by mouth daily.     No current facility-administered medications on file prior to visit.    Allergies  Allergen Reactions   Aspirin Swelling    Bufferin lips, hives   Other Other (See Comments)    Ragweed, pollen, pine/ nasal congestion and wheezing    Past Medical History:  Diagnosis Date   Allergy    Anxiety    Asthma    Bulging lumbar disc    Controlled type 2 diabetes mellitus with diabetic neuropathy ([facility])    Dermatitis due to drugs  and medicines taken internally(693.0)    Dysrhythmia    GERD (gastroesophageal reflux disease)    History of kidney stones    Hypertension    PONV (postoperative nausea and vomiting)    Pure hyperglyceridemia    Unspecified sleep apnea    CPAP    Past Surgical History:  Procedure Laterality Date   CARPAL TUNNEL RELEASE Left 01/28/2020   Procedure: Left CARPAL TUNNEL RELEASE;  Surgeon: Juanell Fairly, MD;  Location: [facility] ORS;  Service: Orthopedics;  Laterality: Left;   COLONOSCOPY WITH PROPOFOL N/A 06/03/2018   Procedure: COLONOSCOPY WITH PROPOFOL;  Surgeon: Wyline Mood, MD;  Location: [facility] ENDOSCOPY;  Service: Gastroenterology;  Laterality: N/A;   TRIGGER FINGER RELEASE Left 01/28/2020   Procedure: Left middle finger RELEASE TRIGGER FINGER/A-1 PULLEY;   Surgeon: Juanell Fairly, MD;  Location: [facility] ORS;  Service: Orthopedics;  Laterality: Left;   US ECHOCARDIOGRAPHY  10/07   normal, holter benign    WISDOM TOOTH EXTRACTION      Family History  Problem Relation Age of Onset   Cancer Mother    Heart disease Father        MI due to aortic tear, CAD, valve repair   Stroke Father    Spondylolysis Father    Coronary artery disease Father        getting CABG with valve repair   Mental illness Sister    Diabetes Neg Hx     Social History   Socioeconomic History   Marital status: Married    Spouse name: Not on file   Number of children: 2   Years of education: Not on file   Highest education level: Not on file  Occupational History   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  Tobacco Use   Smoking status: Never    Passive exposure: Never   Smokeless tobacco: Never  Vaping Use   Vaping status: Never Used  Substance and Sexual Activity   Alcohol use: Yes    Alcohol/week: 0.0 standard drinks of alcohol    Comment: occasional   Drug use: No   Sexual activity: Not on file  Other Topics Concern   Not on file  Social History Narrative   Not on file   Social Determinants of Health   Financial Resource Strain: Not on file  Food Insecurity: Not on file  Transportation Needs: Not on file  Physical Activity: Not on file  Stress: Not on file  Social Connections: Not on file  Intimate Partner Violence: Not on file    Review of Systems Finally feels better from the COVID infection Now tested negative and feeling back to normal    Objective:   Physical Exam Musculoskeletal:     Comments: Granulated ulcers over 1st MTP on left---has redness around but seems pink/not warm (and may be new skin from abrasion)  Over PIP of right great toe--granulated ulcer without inflammation (had some redness that might have been reaction to bandaid in picture)            Assessment & Plan:

## 2022-12-13 ENCOUNTER — Ambulatory Visit (INDEPENDENT_AMBULATORY_CARE_PROVIDER_SITE_OTHER): Payer: 59 | Admitting: Internal Medicine

## 2022-12-13 ENCOUNTER — Encounter: Payer: Self-pay | Admitting: Internal Medicine

## 2022-12-13 VITALS — BP 130/76 | HR 58 | Temp 97.4°F | Ht 73.25 in | Wt 224.0 lb

## 2022-12-13 DIAGNOSIS — E1140 Type 2 diabetes mellitus with diabetic neuropathy, unspecified: Secondary | ICD-10-CM

## 2022-12-13 DIAGNOSIS — Z0000 Encounter for general adult medical examination without abnormal findings: Secondary | ICD-10-CM

## 2022-12-13 DIAGNOSIS — E119 Type 2 diabetes mellitus without complications: Secondary | ICD-10-CM | POA: Diagnosis not present

## 2022-12-13 DIAGNOSIS — I10 Essential (primary) hypertension: Secondary | ICD-10-CM

## 2022-12-13 DIAGNOSIS — Z7984 Long term (current) use of oral hypoglycemic drugs: Secondary | ICD-10-CM

## 2022-12-13 DIAGNOSIS — Z125 Encounter for screening for malignant neoplasm of prostate: Secondary | ICD-10-CM

## 2022-12-13 DIAGNOSIS — G4733 Obstructive sleep apnea (adult) (pediatric): Secondary | ICD-10-CM

## 2022-12-13 LAB — HM DIABETES FOOT EXAM

## 2022-12-13 NOTE — Assessment & Plan Note (Signed)
BP Readings from Last 3 Encounters:  12/13/22 130/76  11/21/22 112/84  06/14/22 122/80   Okay on maxzide 25

## 2022-12-13 NOTE — Assessment & Plan Note (Addendum)
Seems to have good control Jardiance 10, metformin 1500 daily Gabapentin for the neuropathy--he wondered about lyrica (no change for now) Discussed lidocaine/capsaicin/TENS

## 2022-12-13 NOTE — Assessment & Plan Note (Signed)
Healthy in general Is trying to increase exercise Colon due again 2027 Will check PSA Updated COVID vaccine and flu--later in fall

## 2022-12-13 NOTE — Progress Notes (Signed)
Subjective:    Patient ID: Joseph Jackson, male    DOB: 08/16/1965, 57 y.o.   MRN: [id number]  HPI Here for physical  Had bad COVID in July--despite the booster Some fatigue feeling   Checks sugars ---in 90's sometimes.  Highest is ~150 Ongoing neuropathy---numbness then pain at times Gabapentin does help at night --burning goes down and he can sleep  Works from [employer]  Current Outpatient Medications on File Prior to Visit  Medication Sig Dispense Refill   albuterol (VENTOLIN HFA) 108 (90 Base) MCG/ACT inhaler TAKE 2 PUFFS BY MOUTH EVERY 6 HOURS AS NEEDED FOR WHEEZE OR SHORTNESS OF BREATH 8.5 each 1   Alpha-Lipoic Acid 600 MG TABS Take 2 tablets by mouth daily at 2 PM.     B Complex-Biotin-FA (B-COMPLEX PO) Take 1 tablet by mouth daily.     calcipotriene (DOVONOX) 0.005 % cream Apply topically as needed. (Patient taking differently: Apply 1 application  topically 2 (two) times daily as needed (psoriasis).) 60 g 1   Cholecalciferol (VITAMIN D-3) 125 MCG (5000 UT) TABS Take 1 tablet by mouth once a week.     clobetasol ointment (TEMOVATE) 0.05 % Apply 1 application topically as needed. (Patient taking differently: Apply 1 application  topically 2 (two) times daily as needed (psoriasis).) 30 g 1   fluticasone (FLONASE) 50 MCG/ACT nasal spray Place 2 sprays into the nose daily.     gabapentin (NEURONTIN) 300 MG capsule Take 1 capsule (300 mg total) by mouth 3 (three) times daily. 90 capsule 11   glucose blood (ONETOUCH ULTRA) test strip USE CHECK BLOOD SUGAR ONCE DAILY. 100 strip 3   JARDIANCE 10 MG TABS tablet TAKE 1 TABLET BY MOUTH EVERY DAY 90 tablet 3   Lancets (ONETOUCH ULTRASOFT) lancets Use as instructed 100 each 0   levocetirizine (XYZAL) 5 MG tablet Take 5 mg by mouth daily.     metFORMIN (GLUCOPHAGE-XR) 500 MG 24 hr tablet TAKE 3 TABLETS (1,500 MG TOTAL) BY MOUTH DAILY WITH BREAKFAST 270 tablet 0   olopatadine (PATANOL) 0.1 % ophthalmic solution Place 1 drop into  both eyes 2 (two) times daily. (Patient taking differently: Place 1 drop into both eyes daily.) 5 mL 5   Omega-3 Fatty Acids (FISH OIL) 1200 MG CAPS Take 2 capsules by mouth at bedtime.     pantoprazole (PROTONIX) 40 MG tablet TAKE 1 TABLET BY MOUTH EVERY DAY (Patient taking differently: 40 mg every other day.) 90 tablet 1   rosuvastatin (CRESTOR) 10 MG tablet TAKE 1 TABLET BY MOUTH EVERY DAY 90 tablet 3   triamterene-hydrochlorothiazide (MAXZIDE-25) 37.5-25 MG tablet TAKE 1 TABLET BY MOUTH EVERY DAY 90 tablet 1   vitamin E 180 MG (400 UNITS) capsule Take 400 Units by mouth daily.     No current facility-administered medications on file prior to visit.    Allergies  Allergen Reactions   Aspirin Swelling    Bufferin lips, hives   Other Other (See Comments)    Ragweed, pollen, pine/ nasal congestion and wheezing    Past Medical History:  Diagnosis Date   Allergy    Anxiety    Asthma    Bulging lumbar disc    Controlled type 2 diabetes mellitus with diabetic neuropathy (HCC)    Dermatitis due to drugs and medicines taken internally(693.0)    Dysrhythmia    GERD (gastroesophageal reflux disease)    History of kidney stones    Hypertension    PONV (postoperative nausea and vomiting)  Pure hyperglyceridemia    Unspecified sleep apnea    CPAP    Past Surgical History:  Procedure Laterality Date   CARPAL TUNNEL RELEASE Left 01/28/2020   Procedure: Left CARPAL TUNNEL RELEASE;  Surgeon: Juanell Fairly, MD;  Location: [facility] ORS;  Service: Orthopedics;  Laterality: Left;   COLONOSCOPY WITH PROPOFOL N/A 06/03/2018   Procedure: COLONOSCOPY WITH PROPOFOL;  Surgeon: Wyline Mood, MD;  Location: [facility] ENDOSCOPY;  Service: Gastroenterology;  Laterality: N/A;   TRIGGER FINGER RELEASE Left 01/28/2020   Procedure: Left middle finger RELEASE TRIGGER FINGER/A-1 PULLEY;  Surgeon: Juanell Fairly, MD;  Location: [facility] ORS;  Service: Orthopedics;  Laterality: Left;   US ECHOCARDIOGRAPHY  10/07    normal, holter benign    WISDOM TOOTH EXTRACTION      Family History  Problem Relation Age of Onset   Cancer Mother    Heart disease Father        MI due to aortic tear, CAD, valve repair   Stroke Father    Spondylolysis Father    Coronary artery disease Father        getting CABG with valve repair   Mental illness Sister    Diabetes Neg Hx     Social History   Socioeconomic History   Marital status: Married    Spouse name: Not on file   Number of children: 2   Years of education: Not on file   Highest education level: Not on file  Occupational History   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  Tobacco Use   Smoking status: Never    Passive exposure: Never   Smokeless tobacco: Never  Vaping Use   Vaping status: Never Used  Substance and Sexual Activity   Alcohol use: Yes    Alcohol/week: 0.0 standard drinks of alcohol    Comment: occasional   Drug use: No   Sexual activity: Not on file  Other Topics Concern   Not on file  Social History Narrative   Not on file   Social Determinants of Health   Financial Resource Strain: Not on file  Food Insecurity: Not on file  Transportation Needs: Not on file  Physical Activity: Not on file  Stress: Not on file  Social Connections: Not on file  Intimate Partner Violence: Not on file   Review of Systems  Constitutional:  Positive for fatigue. Negative for unexpected weight change.       Wears seat belt  HENT:  Positive for tinnitus. Negative for hearing loss.        Ears get plugged with allergies--causes dizziness Teeth pain with sinuses---keeps up with dentist  Eyes:  Negative for visual disturbance.       No diplopia or unilateral vision loss  Respiratory:  Negative for chest tightness and shortness of breath.        Cough mostly gone since COVID---some allergy cough  Cardiovascular:  Negative for chest pain, palpitations and leg swelling.  Gastrointestinal:  Negative for blood in stool  and constipation.       No heartburn   Endocrine: Negative for polydipsia and polyuria.  Genitourinary:  Positive for frequency. Negative for difficulty urinating and urgency.       No sexual problems---not much libido  Musculoskeletal:  Negative for back pain and joint swelling.       Some hand pain---early trigger fingers  Skin:        Mild rash at times on trunk  Allergic/Immunologic: Positive for environmental  allergies. Negative for immunocompromised state.       Satisfied with xyzal/patanol/flonase prn  Neurological:  Negative for syncope and light-headedness.       Occ sinus headaches  Hematological:  Does not bruise/bleed easily.       Neck glands in allergy season  Psychiatric/Behavioral:  Negative for dysphoric mood and sleep disturbance.        Some anxiety about wife's failing health       Objective:   Physical Exam Constitutional:      Appearance: Normal appearance.  HENT:     Mouth/Throat:     Pharynx: No oropharyngeal exudate or posterior oropharyngeal erythema.  Eyes:     Conjunctiva/sclera: Conjunctivae normal.     Pupils: Pupils are equal, round, and reactive to light.  Cardiovascular:     Rate and Rhythm: Normal rate and regular rhythm.     Pulses: Normal pulses.     Heart sounds: No murmur heard.    No gallop.  Pulmonary:     Effort: Pulmonary effort is normal.     Breath sounds: Normal breath sounds. No wheezing or rales.  Abdominal:     Palpations: Abdomen is soft.     Tenderness: There is no abdominal tenderness.  Musculoskeletal:     Cervical back: Neck supple.     Right lower leg: No edema.     Left lower leg: No edema.  Lymphadenopathy:     Cervical: No cervical adenopathy.  Skin:    Findings: No lesion or rash.     Comments: No foot lesions  Neurological:     General: No focal deficit present.     Mental Status: He is alert and oriented to person, place, and time.     Comments: Decreased sensation in feet  Psychiatric:        Mood and  Affect: Mood normal.        Behavior: Behavior normal.            Assessment & Plan:

## 2022-12-13 NOTE — Assessment & Plan Note (Addendum)
Uses CPAP nightly Clearly benefits from it Will likely need new one soon

## 2022-12-14 LAB — LIPID PANEL
Cholesterol: 174 mg/dL (ref 0–200)
HDL: 35.3 mg/dL — ABNORMAL LOW (ref >39.00)
NonHDL: 138.38
Total CHOL/HDL Ratio: 5
Triglycerides: 201 mg/dL — ABNORMAL HIGH (ref 0.0–149.0)
VLDL: 40.2 mg/dL — ABNORMAL HIGH (ref 0.0–40.0)

## 2022-12-14 LAB — COMPREHENSIVE METABOLIC PANEL
ALT: 21 U/L (ref 0–53)
AST: 18 U/L (ref 0–37)
Albumin: 4.7 g/dL (ref 3.5–5.2)
Alkaline Phosphatase: 45 U/L (ref 39–117)
BUN: 17 mg/dL (ref 6–23)
CO2: 29 meq/L (ref 19–32)
Calcium: 9.6 mg/dL (ref 8.4–10.5)
Chloride: 98 mEq/L (ref 96–112)
Creatinine, Ser: 0.82 mg/dL (ref 0.40–1.50)
GFR: 96.92 mL/min (ref >60.00)
Glucose, Bld: 97 mg/dL (ref 70–99)
Potassium: 3.7 meq/L (ref 3.5–5.1)
Sodium: 138 meq/L (ref 135–145)
Total Bilirubin: 1 mg/dL (ref 0.2–1.2)
Total Protein: 7 g/dL (ref 6.0–8.3)

## 2022-12-14 LAB — CBC
HCT: 45.9 % (ref 39.0–52.0)
Hemoglobin: 15.5 g/dL (ref 13.0–17.0)
MCHC: 33.8 g/dL (ref 30.0–36.0)
MCV: 86.2 fl (ref 78.0–100.0)
Platelets: 201 10*3/uL (ref 150.0–400.0)
RBC: 5.32 Mil/uL (ref 4.22–5.81)
RDW: 13.2 % (ref 11.5–15.5)
WBC: 7.5 10*3/uL (ref 4.0–10.5)

## 2022-12-14 LAB — MICROALBUMIN / CREATININE URINE RATIO
Creatinine,U: 100.7 mg/dL
Microalb Creat Ratio: 1.2 mg/g (ref 0.0–30.0)
Microalb, Ur: 1.2 mg/dL (ref 0.0–1.9)

## 2022-12-14 LAB — TSH: TSH: 0.77 u[IU]/mL (ref 0.35–5.50)

## 2022-12-14 LAB — LDL CHOLESTEROL, DIRECT: Direct LDL: 133 mg/dL

## 2022-12-14 LAB — HEMOGLOBIN A1C: Hgb A1c MFr Bld: 7.3 % — ABNORMAL HIGH (ref 4.6–6.5)

## 2022-12-14 LAB — PSA: PSA: 0.57 ng/mL (ref 0.10–4.00)

## 2022-12-18 ENCOUNTER — Other Ambulatory Visit: Payer: Self-pay | Admitting: Internal Medicine

## 2022-12-20 ENCOUNTER — Other Ambulatory Visit: Payer: Self-pay | Admitting: Internal Medicine

## 2023-01-27 ENCOUNTER — Other Ambulatory Visit: Payer: Self-pay | Admitting: Internal Medicine

## 2023-02-09 ENCOUNTER — Other Ambulatory Visit: Payer: Self-pay | Admitting: Medical Genetics

## 2023-02-09 DIAGNOSIS — Z006 Encounter for examination for normal comparison and control in clinical research program: Secondary | ICD-10-CM

## 2023-02-14 ENCOUNTER — Encounter: Payer: Self-pay | Admitting: Internal Medicine

## 2023-03-05 ENCOUNTER — Telehealth: Payer: Self-pay | Admitting: Internal Medicine

## 2023-03-05 NOTE — Telephone Encounter (Signed)
Routed OV Note from 12-13-22 to Nina's attention. Sent new updated script with heated humidifier added. Received fax confirmation.

## 2023-03-05 NOTE — Telephone Encounter (Signed)
Joseph Jackson from [facility] called in stating that they are needing a revised rx that has a heated humidifier added to it for this patient. They also need clinical notes stating that the patient is using and benefiting from the use of the cpap machine.   Fax#:[phone]

## 2023-03-07 ENCOUNTER — Other Ambulatory Visit (HOSPITAL_COMMUNITY)
Admission: RE | Admit: 2023-03-07 | Discharge: 2023-03-07 | Disposition: A | Discharge status: Home / Self Care | Payer: 59 | Source: Ambulatory Visit | Attending: Oncology | Admitting: Oncology

## 2023-03-07 DIAGNOSIS — Z006 Encounter for examination for normal comparison and control in clinical research program: Secondary | ICD-10-CM | POA: Insufficient documentation

## 2023-03-08 ENCOUNTER — Other Ambulatory Visit: Payer: Self-pay | Admitting: Internal Medicine

## 2023-03-19 DIAGNOSIS — G4733 Obstructive sleep apnea (adult) (pediatric): Secondary | ICD-10-CM | POA: Diagnosis not present

## 2023-04-09 LAB — GENECONNECT MOLECULAR SCREEN

## 2023-04-15 ENCOUNTER — Other Ambulatory Visit: Payer: Self-pay | Admitting: Medical Genetics

## 2023-04-15 DIAGNOSIS — Z006 Encounter for examination for normal comparison and control in clinical research program: Secondary | ICD-10-CM | POA: Insufficient documentation

## 2023-04-15 NOTE — Progress Notes (Signed)
Initial sample was TNP. Confirmed consent on file.

## 2023-04-16 ENCOUNTER — Other Ambulatory Visit (HOSPITAL_COMMUNITY)
Admission: RE | Admit: 2023-04-16 | Discharge: 2023-04-16 | Disposition: A | Discharge status: Home / Self Care | Payer: Self-pay | Source: Ambulatory Visit | Attending: Oncology | Admitting: Oncology

## 2023-04-16 DIAGNOSIS — Z006 Encounter for examination for normal comparison and control in clinical research program: Secondary | ICD-10-CM | POA: Insufficient documentation

## 2023-04-18 ENCOUNTER — Encounter: Payer: Self-pay | Admitting: Internal Medicine

## 2023-04-22 ENCOUNTER — Ambulatory Visit: Payer: Medicaid Other | Admitting: Internal Medicine

## 2023-04-22 VITALS — BP 104/80 | HR 66 | Temp 98.3°F | Ht 73.25 in | Wt 223.0 lb

## 2023-04-22 DIAGNOSIS — G4733 Obstructive sleep apnea (adult) (pediatric): Secondary | ICD-10-CM

## 2023-04-22 MED ORDER — CALCIPOTRIENE 0.005 % EX CREA: 1.0000 | TOPICAL_CREAM | Freq: Two times a day (BID) | CUTANEOUS | 1 refills | Status: AC | PRN

## 2023-04-22 MED ORDER — CLOBETASOL PROPIONATE 0.05 % EX OINT: 1.0000 | TOPICAL_OINTMENT | Freq: Two times a day (BID) | CUTANEOUS | 1 refills | Status: AC | PRN

## 2023-04-22 NOTE — Assessment & Plan Note (Signed)
 Had done well with the static 12 cm setting and has not tolerated the autotiritrate Will send new order to reset it to 12cm water constant again

## 2023-04-22 NOTE — Progress Notes (Signed)
 Subjective:    Patient ID: Joseph Jackson, male    DOB: 1965-04-23, 58 y.o.   MRN: [id number]  HPI Here due to problems with new CPAP machine  Doesn't think the auto-titrate is working Had emailed me about this--tried to test it He wants to go back to the 12 cm water setting --constant  Has had some awakening at night, etc Did take machine to the provider---they tested it and thought it was working okay  Current Outpatient Medications on File Prior to Visit  Medication Sig Dispense Refill   albuterol  (VENTOLIN  HFA) 108 (90 Base) MCG/ACT inhaler TAKE 2 PUFFS BY MOUTH EVERY 6 HOURS AS NEEDED FOR WHEEZE OR SHORTNESS OF BREATH 8.5 each 1   Alpha-Lipoic Acid 600 MG TABS Take 2 tablets by mouth daily at 2 PM.     B Complex-Biotin-FA (B-COMPLEX PO) Take 1 tablet by mouth daily.     calcipotriene  (DOVONOX) 0.005 % cream Apply topically as needed. (Patient taking differently: Apply 1 application  topically 2 (two) times daily as needed (psoriasis).) 60 g 1   Cholecalciferol (VITAMIN D-3) 125 MCG (5000 UT) TABS Take 1 tablet by mouth once a week.     clobetasol  ointment (TEMOVATE ) 0.05 % Apply 1 application topically as needed. (Patient taking differently: Apply 1 application  topically 2 (two) times daily as needed (psoriasis).) 30 g 1   fluticasone  (FLONASE ) 50 MCG/ACT nasal spray Place 2 sprays into the nose daily.     gabapentin  (NEURONTIN ) 300 MG capsule Take 1 capsule (300 mg total) by mouth 3 (three) times daily. 90 capsule 11   glucose blood (ONETOUCH ULTRA) test strip USE CHECK BLOOD SUGAR ONCE DAILY. 100 strip 3   JARDIANCE  10 MG TABS tablet TAKE 1 TABLET BY MOUTH EVERY DAY 90 tablet 3   Lancets (ONETOUCH ULTRASOFT) lancets Use as instructed 100 each 0   levocetirizine (XYZAL) 5 MG tablet Take 5 mg by mouth daily.     metFORMIN  (GLUCOPHAGE -XR) 500 MG 24 hr tablet TAKE 3 TABLETS (1,500 MG TOTAL) BY MOUTH DAILY WITH BREAKFAST 270 tablet 3   olopatadine  (PATANOL) 0.1 % ophthalmic  solution Place 1 drop into both eyes 2 (two) times daily. (Patient taking differently: Place 1 drop into both eyes daily.) 5 mL 5   Omega-3 Fatty Acids (FISH OIL) 1200 MG CAPS Take 2 capsules by mouth at bedtime.     pantoprazole  (PROTONIX ) 40 MG tablet TAKE 1 TABLET BY MOUTH EVERY DAY (Patient taking differently: 40 mg every other day.) 90 tablet 1   rosuvastatin  (CRESTOR ) 10 MG tablet TAKE 1 TABLET BY MOUTH EVERY DAY 90 tablet 3   triamterene -hydrochlorothiazide (MAXZIDE-25) 37.5-25 MG tablet TAKE 1 TABLET BY MOUTH EVERY DAY 90 tablet 3   vitamin E 180 MG (400 UNITS) capsule Take 400 Units by mouth daily.     No current facility-administered medications on file prior to visit.    Allergies  Allergen Reactions   Aspirin Swelling    Bufferin lips, hives   Other Other (See Comments)    Ragweed, pollen, pine/ nasal congestion and wheezing    Past Medical History:  Diagnosis Date   Allergy    Anxiety    Asthma    Bulging lumbar disc    Controlled type 2 diabetes mellitus with diabetic neuropathy (HCC)    Dermatitis due to drugs and medicines taken internally(693.0)    Dysrhythmia    GERD (gastroesophageal reflux disease)    History of kidney stones  Hypertension    PONV (postoperative nausea and vomiting)    Pure hyperglyceridemia    Unspecified sleep apnea    CPAP    Past Surgical History:  Procedure Laterality Date   CARPAL TUNNEL RELEASE Left 01/28/2020   Procedure: Left CARPAL TUNNEL RELEASE;  Surgeon: Marchia Drivers, MD;  Location: [facility] ORS;  Service: Orthopedics;  Laterality: Left;   COLONOSCOPY WITH PROPOFOL  N/A 06/03/2018   Procedure: COLONOSCOPY WITH PROPOFOL ;  Surgeon: Therisa Bi, MD;  Location: [facility] ENDOSCOPY;  Service: Gastroenterology;  Laterality: N/A;   TRIGGER FINGER RELEASE Left 01/28/2020   Procedure: Left middle finger RELEASE TRIGGER FINGER/A-1 PULLEY;  Surgeon: Marchia Drivers, MD;  Location: [facility] ORS;  Service: Orthopedics;  Laterality: Left;   US   ECHOCARDIOGRAPHY  10/07   normal, holter benign    WISDOM TOOTH EXTRACTION      Family History  Problem Relation Age of Onset   Cancer Mother    Heart disease Father        MI due to aortic tear, CAD, valve repair   Stroke Father    Spondylolysis Father    Coronary artery disease Father        getting CABG with valve repair   Mental illness Sister    Diabetes Neg Hx     Social History   Socioeconomic History   Marital status: Married    Spouse name: Not on file   Number of children: 2   Years of education: Not on file   Highest education level: Some college, no degree  Occupational History   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  Tobacco Use   Smoking status: Never    Passive exposure: Never   Smokeless tobacco: Never  Vaping Use   Vaping status: Never Used  Substance and Sexual Activity   Alcohol use: Yes    Alcohol/week: 0.0 standard drinks of alcohol    Comment: occasional   Drug use: No   Sexual activity: Not on file  Other Topics Concern   Not on file  Social History Narrative   Not on file   Social Drivers of Health   Financial Resource Strain: Low Risk  (04/22/2023)   Overall Financial Resource Strain (CARDIA)    Difficulty of Paying Living Expenses: Not very hard  Food Insecurity: Food Insecurity Present (04/22/2023)   Hunger Vital Sign    Worried About Running Out of Food in the Last Year: Sometimes true    Ran Out of Food in the Last Year: Sometimes true  Transportation Needs: No Transportation Needs (04/22/2023)   PRAPARE - Administrator, Civil Service (Medical): No    Lack of Transportation (Non-Medical): No  Physical Activity: Insufficiently Active (04/22/2023)   Exercise Vital Sign    Days of Exercise per Week: 3 days    Minutes of Exercise per Session: 30 min  Stress: No Stress Concern Present (04/22/2023)   [employer] of Occupational Health - Occupational Stress Questionnaire    Feeling of Stress :  Only a little  Social Connections: Moderately Isolated (04/22/2023)   Social Connection and Isolation Panel [NHANES]    Frequency of Communication with Friends and Family: Once a week    Frequency of Social Gatherings with Friends and Family: Twice a week    Attends Religious Services: Never    Database Administrator or Organizations: No    Attends Engineer, Structural: Not on file    Marital Status: Married  Intimate  Partner Violence: Not on file   Review of Systems     Objective:   Physical Exam Psychiatric:        Mood and Affect: Mood normal.        Behavior: Behavior normal.            Assessment & Plan:

## 2023-04-23 ENCOUNTER — Telehealth: Payer: Self-pay

## 2023-04-23 NOTE — Telephone Encounter (Signed)
 Ov Note from 04-22-23 and Rx for new CPAP orders faxed to [facility]. Fax placed in Faxed Forms Folder at my desk.

## 2023-04-29 LAB — GENECONNECT MOLECULAR SCREEN: Genetic Analysis Overall Interpretation: NEGATIVE

## 2023-05-02 ENCOUNTER — Encounter: Payer: Self-pay | Admitting: Internal Medicine

## 2023-05-09 ENCOUNTER — Telehealth: Payer: Self-pay

## 2023-05-09 ENCOUNTER — Other Ambulatory Visit (HOSPITAL_COMMUNITY): Payer: Self-pay

## 2023-05-09 NOTE — Telephone Encounter (Signed)
Pharmacy Patient Advocate Encounter   Received notification from Patient Advice Request messages that prior authorization for Jardiance 10MG  tablets is required/requested.   Insurance verification completed.   The patient is insured through [facility] .   Per test claim: PA required and submitted KEY/EOC/Request #: [id number] CANCELLED due to: Submit Bill To Other Processor Or Primary Payer   Patient will have to contact Medicaid to let them know he no longer has another insurance.

## 2023-05-09 NOTE — Telephone Encounter (Signed)
Pt states [employer] told him to have you use the key [id number] on CMM.

## 2023-05-10 NOTE — Telephone Encounter (Signed)
[facility] called asking questions for the PA for the meds, Jardiance. Rep states she was unable to leave a call back # due to HIPAA. Spoke to [place], was told the issue with the PA was due to the [employer], Medicaid. Rep have no further questions.

## 2023-05-14 ENCOUNTER — Other Ambulatory Visit (HOSPITAL_COMMUNITY): Payer: Self-pay

## 2023-05-21 ENCOUNTER — Other Ambulatory Visit (HOSPITAL_COMMUNITY): Payer: Self-pay

## 2023-05-21 ENCOUNTER — Telehealth: Payer: Self-pay

## 2023-05-21 NOTE — Telephone Encounter (Signed)
PA request has been Submitted. New Encounter created for follow up. For additional info see Pharmacy Prior Auth telephone encounter from 02/04.

## 2023-05-21 NOTE — Telephone Encounter (Signed)
*  Primary  Pharmacy Patient Advocate Encounter   Received notification from Patient Advice Request messages that prior authorization for Jardiance  10MG  tablets  is required/requested.   Insurance verification completed.   The patient is insured through [facility] .   Per test claim: PA required; PA submitted to above mentioned insurance via CoverMyMeds Key/confirmation #/EOC [id number] Status is pending

## 2023-05-22 ENCOUNTER — Encounter: Payer: Self-pay | Admitting: Internal Medicine

## 2023-05-23 DIAGNOSIS — G4733 Obstructive sleep apnea (adult) (pediatric): Secondary | ICD-10-CM | POA: Diagnosis not present

## 2023-05-24 ENCOUNTER — Other Ambulatory Visit (HOSPITAL_BASED_OUTPATIENT_CLINIC_OR_DEPARTMENT_OTHER): Payer: Self-pay

## 2023-05-24 NOTE — Telephone Encounter (Signed)
 Pharmacy Patient Advocate Encounter  Received notification from [facility] that Prior Authorization for Jardiance  10MG  tablets has been APPROVED from 05/21/23 to 05/20/24. Unable to obtain price due to refill too soon rejection, last fill date 05/23/23 next available fill date04/15/25   PA #/Case ID/Reference #: [id number]

## 2023-05-29 DIAGNOSIS — G4733 Obstructive sleep apnea (adult) (pediatric): Secondary | ICD-10-CM | POA: Diagnosis not present

## 2023-05-30 ENCOUNTER — Other Ambulatory Visit (HOSPITAL_COMMUNITY): Payer: Self-pay

## 2023-06-17 ENCOUNTER — Ambulatory Visit: Payer: Medicaid Other | Admitting: Internal Medicine

## 2023-06-18 ENCOUNTER — Encounter: Payer: Self-pay | Admitting: Internal Medicine

## 2023-06-26 DIAGNOSIS — G4733 Obstructive sleep apnea (adult) (pediatric): Secondary | ICD-10-CM | POA: Diagnosis not present

## 2023-07-04 ENCOUNTER — Encounter: Payer: Self-pay | Admitting: Internal Medicine

## 2023-07-04 ENCOUNTER — Ambulatory Visit: Admitting: Internal Medicine

## 2023-07-04 VITALS — BP 124/84 | HR 62 | Temp 98.4°F | Ht 73.25 in | Wt 230.0 lb

## 2023-07-04 DIAGNOSIS — K219 Gastro-esophageal reflux disease without esophagitis: Secondary | ICD-10-CM | POA: Diagnosis not present

## 2023-07-04 DIAGNOSIS — Z7984 Long term (current) use of oral hypoglycemic drugs: Secondary | ICD-10-CM | POA: Diagnosis not present

## 2023-07-04 DIAGNOSIS — I10 Essential (primary) hypertension: Secondary | ICD-10-CM | POA: Diagnosis not present

## 2023-07-04 DIAGNOSIS — E1140 Type 2 diabetes mellitus with diabetic neuropathy, unspecified: Secondary | ICD-10-CM

## 2023-07-04 DIAGNOSIS — E119 Type 2 diabetes mellitus without complications: Secondary | ICD-10-CM

## 2023-07-04 LAB — POCT GLYCOSYLATED HEMOGLOBIN (HGB A1C): Hemoglobin A1C: 7.6 % — AB (ref 4.0–5.6)

## 2023-07-04 MED ORDER — GABAPENTIN 300 MG PO CAPS: 900.0000 mg | ORAL_CAPSULE | Freq: Every day | ORAL | 3 refills | Status: AC

## 2023-07-04 MED ORDER — PANTOPRAZOLE SODIUM 40 MG PO TBEC: 40.0000 mg | DELAYED_RELEASE_TABLET | ORAL | 0 refills | Status: DC

## 2023-07-04 NOTE — Assessment & Plan Note (Signed)
 Controlled with pantoprazole three days a week

## 2023-07-04 NOTE — Progress Notes (Signed)
 Subjective:    Patient ID: Joseph Jackson, male    DOB: 09-18-1965, 58 y.o.   MRN: [id number]  HPI Here for follow up of diabetes and other chronic health conditions  Job has changed----back with State due to [employer] Now in car more---back bothering him some Weight up some--likely related to winter  Checks sugars occasionally Seem fairly consistent Did lose a month of jardiance with insurance issues--back on it (for a month) Neuropathy stable--still on alpha lipoic acid  No chest pain or SOB Just has increasing pollen related to springtime No dizziness or syncope  Current Outpatient Medications on File Prior to Visit  Medication Sig Dispense Refill   albuterol (VENTOLIN HFA) 108 (90 Base) MCG/ACT inhaler TAKE 2 PUFFS BY MOUTH EVERY 6 HOURS AS NEEDED FOR WHEEZE OR SHORTNESS OF BREATH 8.5 each 1   Alpha-Lipoic Acid 600 MG TABS Take 2 tablets by mouth daily at 2 PM.     B Complex-Biotin-FA (B-COMPLEX PO) Take 1 tablet by mouth daily.     calcipotriene (DOVONOX) 0.005 % cream Apply 1 Application topically 2 (two) times daily as needed (psoriasis). 60 g 1   Cholecalciferol (VITAMIN D-3) 125 MCG (5000 UT) TABS Take 1 tablet by mouth once a week.     clobetasol ointment (TEMOVATE) 0.05 % Apply 1 Application topically 2 (two) times daily as needed (psoriasis). 30 g 1   fluticasone (FLONASE) 50 MCG/ACT nasal spray Place 2 sprays into the nose daily.     gabapentin (NEURONTIN) 300 MG capsule Take 1 capsule (300 mg total) by mouth 3 (three) times daily. 90 capsule 11   glucose blood (ONETOUCH ULTRA) test strip USE CHECK BLOOD SUGAR ONCE DAILY. 100 strip 3   JARDIANCE 10 MG TABS tablet TAKE 1 TABLET BY MOUTH EVERY DAY 90 tablet 3   Lancets (ONETOUCH ULTRASOFT) lancets Use as instructed 100 each 0   levocetirizine (XYZAL) 5 MG tablet Take 5 mg by mouth daily.     metFORMIN (GLUCOPHAGE-XR) 500 MG 24 hr tablet TAKE 3 TABLETS (1,500 MG TOTAL) BY MOUTH DAILY WITH BREAKFAST 270 tablet 3    olopatadine (PATANOL) 0.1 % ophthalmic solution Place 1 drop into both eyes 2 (two) times daily. (Patient taking differently: Place 1 drop into both eyes daily.) 5 mL 5   Omega-3 Fatty Acids (FISH OIL) 1200 MG CAPS Take 2 capsules by mouth at bedtime.     rosuvastatin (CRESTOR) 10 MG tablet TAKE 1 TABLET BY MOUTH EVERY DAY 90 tablet 3   traMADol (ULTRAM) 50 MG tablet Take 1 tablet by mouth 3 (three) times daily as needed.     triamterene-hydrochlorothiazide (MAXZIDE-25) 37.5-25 MG tablet TAKE 1 TABLET BY MOUTH EVERY DAY 90 tablet 3   vitamin E 180 MG (400 UNITS) capsule Take 400 Units by mouth daily.     No current facility-administered medications on file prior to visit.    Allergies  Allergen Reactions   Aspirin Swelling    Bufferin lips, hives   Other Other (See Comments)    Ragweed, pollen, pine/ nasal congestion and wheezing    Past Medical History:  Diagnosis Date   Allergy    Anxiety    Asthma    Bulging lumbar disc    Controlled type 2 diabetes mellitus with diabetic neuropathy (HCC)    Dermatitis due to drugs and medicines taken internally(693.0)    Dysrhythmia    GERD (gastroesophageal reflux disease)    History of kidney stones    Hypertension  PONV (postoperative nausea and vomiting)    Pure hyperglyceridemia    Unspecified sleep apnea    CPAP    Past Surgical History:  Procedure Laterality Date   CARPAL TUNNEL RELEASE Left 01/28/2020   Procedure: Left CARPAL TUNNEL RELEASE;  Surgeon: Juanell Fairly, MD;  Location: [facility] ORS;  Service: Orthopedics;  Laterality: Left;   COLONOSCOPY WITH PROPOFOL N/A 06/03/2018   Procedure: COLONOSCOPY WITH PROPOFOL;  Surgeon: Wyline Mood, MD;  Location: [facility] ENDOSCOPY;  Service: Gastroenterology;  Laterality: N/A;   TRIGGER FINGER RELEASE Left 01/28/2020   Procedure: Left middle finger RELEASE TRIGGER FINGER/A-1 PULLEY;  Surgeon: Juanell Fairly, MD;  Location: [facility] ORS;  Service: Orthopedics;  Laterality: Left;   US  ECHOCARDIOGRAPHY  10/07   normal, holter benign    WISDOM TOOTH EXTRACTION      Family History  Problem Relation Age of Onset   Cancer Mother    Heart disease Father        MI due to aortic tear, CAD, valve repair   Stroke Father    Spondylolysis Father    Coronary artery disease Father        getting CABG with valve repair   Mental illness Sister    Diabetes Neg Hx     Social History   Socioeconomic History   Marital status: Married    Spouse name: Not on file   Number of children: 2   Years of education: Not on file   Highest education level: Some college, no degree  Occupational History   Occupation: Phone/radio systems    Comment: For [employer]  Tobacco Use   Smoking status: Never    Passive exposure: Never   Smokeless tobacco: Never  Vaping Use   Vaping status: Never Used  Substance and Sexual Activity   Alcohol use: Yes    Alcohol/week: 0.0 standard drinks of alcohol    Comment: occasional   Drug use: No   Sexual activity: Not on file  Other Topics Concern   Not on file  Social History Narrative   Not on file   Social Drivers of Health   Financial Resource Strain: Low Risk  (04/22/2023)   Overall Financial Resource Strain (CARDIA)    Difficulty of Paying Living Expenses: Not very hard  Food Insecurity: Food Insecurity Present (04/22/2023)   Hunger Vital Sign    Worried About Running Out of Food in the Last Year: Sometimes true    Ran Out of Food in the Last Year: Sometimes true  Transportation Needs: No Transportation Needs (04/22/2023)   PRAPARE - Administrator, Civil Service (Medical): No    Lack of Transportation (Non-Medical): No  Physical Activity: Insufficiently Active (04/22/2023)   Exercise Vital Sign    Days of Exercise per Week: 3 days    Minutes of Exercise per Session: 30 min  Stress: No Stress Concern Present (04/22/2023)   [employer] of Occupational Health - Occupational Stress Questionnaire    Feeling of Stress : Only  a little  Social Connections: Moderately Isolated (04/22/2023)   Social Connection and Isolation Panel [NHANES]    Frequency of Communication with Friends and Family: Once a week    Frequency of Social Gatherings with Friends and Family: Twice a week    Attends Religious Services: Never    Database administrator or Organizations: No    Attends Engineer, structural: Not on file    Marital Status: Married  Catering manager Violence: Not on file  Review of Systems Sleeps okay with new CPAP machine Bowels move fine No heartburn with the pantoprazole every other night     Objective:   Physical Exam Constitutional:      Appearance: Normal appearance.  Cardiovascular:     Rate and Rhythm: Normal rate and regular rhythm.     Pulses: Normal pulses.     Heart sounds: No murmur heard.    No gallop.  Pulmonary:     Effort: Pulmonary effort is normal.     Breath sounds: Normal breath sounds. No wheezing or rales.  Musculoskeletal:     Cervical back: Neck supple.     Right lower leg: No edema.     Left lower leg: No edema.  Lymphadenopathy:     Cervical: No cervical adenopathy.  Skin:    Comments: No foot lesions  Neurological:     Mental Status: He is alert.            Assessment & Plan:

## 2023-07-04 NOTE — Assessment & Plan Note (Addendum)
 A1c up some to 7.6%--could be due to lack of jardiance 10mg  for a while Will work on lifestyle Also metformin 1500mg  daily On gabapentin 900mg  at bedtime and alpha lipoic acid for neuropathy Discussed balance

## 2023-07-04 NOTE — Assessment & Plan Note (Signed)
 BP Readings from Last 3 Encounters:  07/04/23 124/84  04/22/23 104/80  12/13/22 130/76    Controlled on maxzide 25 Urine microal negative--would add ARB if worsens

## 2023-07-04 NOTE — Addendum Note (Signed)
 Addended by: Eual Fines on: 07/04/2023 09:34 AM   Modules accepted: Orders

## 2023-07-24 DIAGNOSIS — G4733 Obstructive sleep apnea (adult) (pediatric): Secondary | ICD-10-CM | POA: Diagnosis not present

## 2023-07-27 DIAGNOSIS — G4733 Obstructive sleep apnea (adult) (pediatric): Secondary | ICD-10-CM | POA: Diagnosis not present

## 2023-08-06 ENCOUNTER — Other Ambulatory Visit: Payer: Self-pay

## 2023-08-06 ENCOUNTER — Other Ambulatory Visit: Payer: Self-pay | Admitting: Internal Medicine

## 2023-08-06 MED ORDER — PANTOPRAZOLE SODIUM 40 MG PO TBEC
40.0000 mg | DELAYED_RELEASE_TABLET | ORAL | 1 refills | Status: DC
  Filled 2023-08-06: qty 45, 90d supply, fill #0

## 2023-08-07 ENCOUNTER — Other Ambulatory Visit: Payer: Self-pay

## 2023-09-17 DIAGNOSIS — G4733 Obstructive sleep apnea (adult) (pediatric): Secondary | ICD-10-CM | POA: Diagnosis not present

## 2023-09-26 DIAGNOSIS — G4733 Obstructive sleep apnea (adult) (pediatric): Secondary | ICD-10-CM | POA: Diagnosis not present

## 2023-10-22 DIAGNOSIS — G4733 Obstructive sleep apnea (adult) (pediatric): Secondary | ICD-10-CM | POA: Diagnosis not present

## 2023-10-26 DIAGNOSIS — G4733 Obstructive sleep apnea (adult) (pediatric): Secondary | ICD-10-CM | POA: Diagnosis not present

## 2023-11-23 DIAGNOSIS — G4733 Obstructive sleep apnea (adult) (pediatric): Secondary | ICD-10-CM | POA: Diagnosis not present

## 2023-12-26 ENCOUNTER — Other Ambulatory Visit: Payer: Self-pay | Admitting: *Deleted

## 2023-12-26 MED ORDER — METFORMIN HCL ER 500 MG PO TB24: 1500.0000 mg | ORAL_TABLET | Freq: Every day | ORAL | 0 refills | Status: DC

## 2023-12-30 ENCOUNTER — Ambulatory Visit

## 2023-12-30 VITALS — BP 114/80 | HR 63 | Temp 98.0°F | Ht 73.5 in | Wt 223.0 lb

## 2023-12-30 DIAGNOSIS — Z7984 Long term (current) use of oral hypoglycemic drugs: Secondary | ICD-10-CM

## 2023-12-30 DIAGNOSIS — Z23 Encounter for immunization: Secondary | ICD-10-CM | POA: Diagnosis not present

## 2023-12-30 DIAGNOSIS — E785 Hyperlipidemia, unspecified: Secondary | ICD-10-CM | POA: Diagnosis not present

## 2023-12-30 DIAGNOSIS — G8929 Other chronic pain: Secondary | ICD-10-CM

## 2023-12-30 DIAGNOSIS — Z114 Encounter for screening for human immunodeficiency virus [HIV]: Secondary | ICD-10-CM

## 2023-12-30 DIAGNOSIS — M545 Low back pain, unspecified: Secondary | ICD-10-CM

## 2023-12-30 DIAGNOSIS — Z1159 Encounter for screening for other viral diseases: Secondary | ICD-10-CM

## 2023-12-30 DIAGNOSIS — E119 Type 2 diabetes mellitus without complications: Secondary | ICD-10-CM

## 2023-12-30 DIAGNOSIS — I10 Essential (primary) hypertension: Secondary | ICD-10-CM

## 2023-12-30 LAB — LIPID PANEL
Cholesterol: 213 mg/dL — ABNORMAL HIGH (ref 0–200)
HDL: 37.4 mg/dL — ABNORMAL LOW (ref >39.00)
LDL Cholesterol: 101 mg/dL — ABNORMAL HIGH (ref 0–99)
NonHDL: 175.11
Total CHOL/HDL Ratio: 6
Triglycerides: 369 mg/dL — ABNORMAL HIGH (ref 0.0–149.0)
VLDL: 73.8 mg/dL — ABNORMAL HIGH (ref 0.0–40.0)

## 2023-12-30 LAB — HEMOGLOBIN A1C: Hgb A1c MFr Bld: 7.8 % — ABNORMAL HIGH (ref 4.6–6.5)

## 2023-12-30 MED ORDER — LISINOPRIL 10 MG PO TABS: 10.0000 mg | ORAL_TABLET | Freq: Every day | ORAL | 1 refills | Status: DC

## 2023-12-30 NOTE — Patient Instructions (Addendum)
 Thank you for visiting [facility] today! Here's what we talked about: - Discontinue tramadol  - Start Lisinopril  and monitor BP daily - PT will call you - Discontinue Protonix 

## 2023-12-30 NOTE — Progress Notes (Signed)
 Subjective:    Patient ID: Joseph Jackson, male    DOB: [birth year], 58 y.o.   MRN: [id number]   Joseph Jackson is a very pleasant 58 y.o. male who presents today for follow-up of his chronic conditions. Regarding his back pain, says he never injured it, but does have back pain on and off, has never done physical therapy, says he occasionally takes tramadol  on nights when it is pretty bad.  Denies any radiculopathy symptoms, no bowel/bladder incontinence or saddle anesthesia or fevers/chills. Since regarding his blood pressure he is compliant with his medication but not necessarily checking blood pressure daily Regarding GERD says he has been on Protonix  for years, takes it prophylactically several times a week but says his acid reflux symptoms have generally resolved.  Review of Systems  All other systems reviewed and are negative.       Allergies  Allergen Reactions   Aspirin Swelling    Bufferin lips, hives   Other Other (See Comments)    Ragweed, pollen, pine/ nasal congestion and wheezing    Current Outpatient Medications on File Prior to Visit  Medication Sig Dispense Refill   albuterol  (VENTOLIN  HFA) 108 (90 Base) MCG/ACT inhaler TAKE 2 PUFFS BY MOUTH EVERY 6 HOURS AS NEEDED FOR WHEEZE OR SHORTNESS OF BREATH 8.5 each 1   Alpha-Lipoic Acid 600 MG TABS Take 2 tablets by mouth daily at 2 PM.     B Complex-Biotin-FA (B-COMPLEX PO) Take 1 tablet by mouth daily.     calcipotriene  (DOVONOX) 0.005 % cream Apply 1 Application topically 2 (two) times daily as needed (psoriasis). 60 g 1   Cholecalciferol (VITAMIN D-3) 125 MCG (5000 UT) TABS Take 1 tablet by mouth once a week.     clobetasol  ointment (TEMOVATE ) 0.05 % Apply 1 Application topically 2 (two) times daily as needed (psoriasis). 30 g 1   fluticasone  (FLONASE ) 50 MCG/ACT nasal spray Place 2 sprays into the nose daily.     gabapentin  (NEURONTIN ) 300 MG capsule Take 3 capsules (900 mg total) by mouth at bedtime. 270 capsule  3   glucose blood (ONETOUCH ULTRA) test strip USE CHECK BLOOD SUGAR ONCE DAILY. 100 strip 3   JARDIANCE  10 MG TABS tablet TAKE 1 TABLET BY MOUTH EVERY DAY 90 tablet 3   Lancets (ONETOUCH ULTRASOFT) lancets Use as instructed 100 each 0   levocetirizine (XYZAL) 5 MG tablet Take 5 mg by mouth daily.     metFORMIN  (GLUCOPHAGE -XR) 500 MG 24 hr tablet Take 3 tablets (1,500 mg total) by mouth daily with breakfast. 270 tablet 0   olopatadine  (PATANOL) 0.1 % ophthalmic solution Place 1 drop into both eyes 2 (two) times daily. (Patient taking differently: Place 1 drop into both eyes daily.) 5 mL 5   Omega-3 Fatty Acids (FISH OIL) 1200 MG CAPS Take 2 capsules by mouth at bedtime.     rosuvastatin  (CRESTOR ) 10 MG tablet TAKE 1 TABLET BY MOUTH EVERY DAY 90 tablet 3   vitamin E 180 MG (400 UNITS) capsule Take 400 Units by mouth daily.     No current facility-administered medications on file prior to visit.    BP 114/80 (BP Location: Left Arm, Patient Position: Sitting, Cuff Size: Large)   Pulse 63   Temp 98 F (36.7 C) (Oral)   Ht 6' 1.5 (1.867 m)   Wt 223 lb (101.2 kg)   SpO2 97%   BMI 29.02 kg/m   Objective:    Physical Exam Vitals and nursing note  reviewed.  Constitutional:      Appearance: Normal appearance.  HENT:     Head: Normocephalic and atraumatic.  Eyes:     Extraocular Movements: Extraocular movements intact.     Conjunctiva/sclera: Conjunctivae normal.  Skin:    General: Skin is warm.  Neurological:     Mental Status: He is alert.  Psychiatric:        Mood and Affect: Mood normal.        Behavior: Behavior normal.            Assessment & Plan:   1. Primary hypertension (Primary) Patient is normotensive this visit.  Given coexisting diabetes, we will switch him to an ACE inhibitor as below for renal protection, starting at a low dose, patient to keep home BP log and send me the readings, will uptitrate based on that.  Plan to check BMP in 2 weeks to monitor renal  function and potassium level  - lisinopril  (ZESTRIL ) 10 MG tablet; Take 1 tablet (10 mg total) by mouth daily for 30 doses.  Dispense: 30 tablet; Refill: 1 - Basic Metabolic Panel; Future  2. Diabetes mellitus treated with oral medication [facility]) Per chart review A1c in March 2025 was 7.6, significantly above age-appropriate goal which should be less than 7.  Since then patient has been compliant with medication regimen as below, has also been trying to improve diet and lifestyle, will repeat A1c and augment regimen based on that.  Plan to bring him back for comprehensive diabetic visit in a couple of weeks.  - Hemoglobin A1c  3. Hyperlipidemia, unspecified hyperlipidemia type Patient has not been taking his Crestor  daily, counseled patient about the importance of daily use and the efficacy of statins and reducing cardiovascular outcomes including but not limited to MI and stroke.  Counseled about potential risks/side effects, throat process of shared decision making he opts to start daily use as below, will repeat lipid panel for monitoring, can consider augmenting statin intensity based on results.  - Lipid Panel - Crestor  10 mg daily  4. Chronic low back pain, unspecified back pain laterality, unspecified whether sciatica present No red flag symptoms or direct injury to the back, will manage conservatively at this time with physical therapy, if remains unimproved after completing this, would consider imaging for further workup.  - Ambulatory referral to Physical Therapy  5. Encounter for hepatitis C screening test for low risk patient 6. Screening for HIV without presence of risk factors 7. Need for COVID-19 vaccine See healthcare maintenance screening labs as noted below, COVID-vaccine also ordered per patient's request.  Plan for CPE in the coming weeks.  - Hepatitis C antibody - HIV Antibody (routine testing w rflx) - MODERNA-SPIKEVAX Vaccine 29yrs & up Fall Seasonal Vaccine; Future    Return in about 2 weeks (around 01/13/2024) for Diabetic visit and BP. SABRA   Marilin Kofman K Kimothy Kishimoto, MD  12/30/23

## 2023-12-31 LAB — HEPATITIS C ANTIBODY: Hepatitis C Ab: NONREACTIVE

## 2023-12-31 LAB — HIV ANTIBODY (ROUTINE TESTING W REFLEX)
HIV 1&2 Ab, 4th Generation: NONREACTIVE
HIV FINAL INTERPRETATION: NEGATIVE

## 2024-01-01 ENCOUNTER — Telehealth: Payer: Self-pay

## 2024-01-01 ENCOUNTER — Ambulatory Visit: Payer: Self-pay

## 2024-01-01 NOTE — Telephone Encounter (Signed)
 Nothing needed to be signed. Just needed recent OV note faxed to [facility]. Routed note from Epic Chart.

## 2024-01-01 NOTE — Telephone Encounter (Signed)
 Copied from CRM [id number]. Topic: General - Other >> Jan 01, 2024 11:29 AM Rosina BIRCH wrote: Reason for RMF:Jozk from [facility] called stating he need the chart notes from 9/15 appointment for a cpap order. The form has been faxed also CB [phone]

## 2024-01-02 ENCOUNTER — Telehealth: Payer: Self-pay

## 2024-01-02 NOTE — Telephone Encounter (Signed)
 Copied from CRM [id number]. Topic: Clinical - Request for Lab/Test Order >> Jan 02, 2024 12:38 PM Donna BRAVO wrote: Reason for CRM: patient calling stating he has been messaging through MyChart with Dr Bennett. Patient would like to have imaging ordered and done  before Oct 6th appt

## 2024-01-02 NOTE — Telephone Encounter (Signed)
 Addressed via MyChart

## 2024-01-03 ENCOUNTER — Other Ambulatory Visit: Payer: Self-pay

## 2024-01-03 MED ORDER — ROSUVASTATIN CALCIUM 10 MG PO TABS: 20.0000 mg | ORAL_TABLET | Freq: Every day | ORAL | Status: DC

## 2024-01-03 MED ORDER — METFORMIN HCL ER 500 MG PO TB24: 1000.0000 mg | ORAL_TABLET | Freq: Two times a day (BID) | ORAL | Status: DC

## 2024-01-03 MED ORDER — EMPAGLIFLOZIN 10 MG PO TABS: 20.0000 mg | ORAL_TABLET | Freq: Every day | ORAL | Status: DC

## 2024-01-06 NOTE — Telephone Encounter (Signed)
 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 Due to his medical condition, it is my medical opinion that he should be provided the following accommodations:  - No lifting heavier than 20lbs  In the meantime, he will continue intermittent follow up with my clinic along with physical therapy. Thank you for your consideration.

## 2024-01-08 ENCOUNTER — Telehealth: Payer: Self-pay

## 2024-01-08 NOTE — Telephone Encounter (Signed)
 Spoke to pt. Apologized that the message was forwarded out of my box before I could create the note. But, I did make it and it should be in his MyChart under Letters. He said he had just gotten it.

## 2024-01-08 NOTE — Telephone Encounter (Signed)
 Copied from CRM #[id number]. Topic: General - Other >> Jan 08, 2024  1:47 PM Chiquita SQUIBB wrote: Reason for CRM: Patient is calling in stating he has not received the doctors note for his work yet that him and Dr. Bennett were discussing in [employer] messages. Please advise the patient.

## 2024-01-09 ENCOUNTER — Telehealth: Payer: Self-pay

## 2024-01-09 NOTE — Telephone Encounter (Signed)
 Copied from CRM [id number]. Topic: General - Call Back - No Documentation >> Jan 09, 2024  4:31 PM Rea C wrote: Reason for CRM: Patient would like a call back from Dr. Bennett or her nurse to have a conversation over the phone to go over work documents. Patient did send over documentation and it's posted in MyChart as well. But, patient would prefer a phone call due to paperwork not showing specific work modifications, and essential job functions needing to be filled out.    [phone] BENNIE)

## 2024-01-13 NOTE — Telephone Encounter (Signed)
 The 1st EJ[employer] form and MyChart Letter has been printed and placed in Dr Charlyn inbox in her office. Sent a message to pt asking if the issue with the letter is that it needed a signature on it.

## 2024-01-13 NOTE — Telephone Encounter (Signed)
 Paperwork signed.

## 2024-01-14 NOTE — Telephone Encounter (Signed)
 Spoke to pt. I cannot upload the forms here at the office to go in his chart. He will come get them because he wants them today. They have been placed up front for pickup.

## 2024-01-14 NOTE — Telephone Encounter (Signed)
 Copied from CRM [id number]. Topic: General - Other >> Jan 14, 2024  9:57 AM Thersia BROCKS wrote: Reason for CRM: Patient called in regarding the status of the paperwork, stated that his employer needs it soon and would like for someone to follow up with that.

## 2024-01-14 NOTE — Telephone Encounter (Signed)
 Patient picked up forms.

## 2024-01-19 NOTE — Therapy (Addendum)
 " OUTPATIENT PHYSICAL THERAPY THORACOLUMBAR EVALUATION / DC SUMMARY   Patient Name: Joseph Jackson MRN: [id number] DOB:March 14, 1966, 58 y.o., male Today's Date: 01/20/2024  END OF SESSION:  PT End of Session - 01/20/24 1458     Visit Number 1    Date for Recertification  03/16/24    PT Start Time 1458    PT Stop Time 1536    PT Time Calculation (min) 38 min    Activity Tolerance Patient tolerated treatment well;No increased pain    Behavior During Therapy WFL for tasks assessed/performed          Past Medical History:  Diagnosis Date   Allergy    Anxiety    Asthma    Bulging lumbar disc    Controlled type 2 diabetes mellitus with diabetic neuropathy (HCC)    Dermatitis due to drugs and medicines taken internally(693.0)    Dysrhythmia    GERD (gastroesophageal reflux disease)    History of kidney stones    Hypertension    PONV (postoperative nausea and vomiting)    Pure hyperglyceridemia    Unspecified sleep apnea    CPAP   Past Surgical History:  Procedure Laterality Date   CARPAL TUNNEL RELEASE Left 01/28/2020   Procedure: Left CARPAL TUNNEL RELEASE;  Surgeon: Marchia Drivers, MD;  Location: [facility] ORS;  Service: Orthopedics;  Laterality: Left;   COLONOSCOPY WITH PROPOFOL  N/A 06/03/2018   Procedure: COLONOSCOPY WITH PROPOFOL ;  Surgeon: Therisa Bi, MD;  Location: [facility] ENDOSCOPY;  Service: Gastroenterology;  Laterality: N/A;   TRIGGER FINGER RELEASE Left 01/28/2020   Procedure: Left middle finger RELEASE TRIGGER FINGER/A-1 PULLEY;  Surgeon: Marchia Drivers, MD;  Location: [facility] ORS;  Service: Orthopedics;  Laterality: Left;   US  ECHOCARDIOGRAPHY  10/07   normal, holter benign    WISDOM TOOTH EXTRACTION     Patient Active Problem List   Diagnosis Date Noted   Clinical trial participant 04/15/2023   Diabetes mellitus treated with oral medication (HCC) 12/13/2022   Hypertension    Controlled type 2 diabetes mellitus with diabetic neuropathy (HCC)    Chronic back pain  04/24/2018   Nephrolithiasis    OSA (obstructive sleep apnea) 05/27/2014   Sensory loss 01/07/2012   Allergic asthma 11/28/2006   GERD 07/25/2006    PCP: Bennett Reuben POUR, MD   REFERRING PROVIDER: Bennett Reuben POUR, MD   REFERRING DIAG: M54.50,G89.29 (ICD-10-CM) - Chronic low back pain, unspecified back pain laterality, unspecified whether sciatica present  THERAPY DIAG:  Other low back pain  Muscle weakness (generalized)  RATIONALE FOR EVALUATION AND TREATMENT: Rehabilitation  ONSET DATE: 01/09/24  NEXT MD VISIT:    SUBJECTIVE:  SUBJECTIVE STATEMENT: Patient is a 58 y/o referred to PT from PCP for LBP.   States pain started on 01/09/24.   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  States had to replace radio batteries on top of [employer].  Batteries weight 100 lbs and had to carry them up 12-14 steps once he got off the elevator and carry them to the radio units on the roof.  States pain started about 3-4 days later.  He has old lumbar MRI from 2021 that show disc bulging at L4/L5 with potential for R L5 nerve root encroachment. States he has had PT for his back previously, he  and found some of his old exercises.   States he has been doing them for the last week and has gotten almost 100-% relief.   He is also using an inversion table at home.  He feels this in combination with being out of work x 2 weeks has helped him recover.  He anticipates returning to work next week at fully duty.    PAIN: Are you having pain? Yes: NPRS scale: 0/10 now;  1/10 worst x 3 days Pain location: central LB Pain description: aching, throbbing Aggravating factors: sitting and driving long times in car;  standing long periods Relieving factors: lying flat on  back;  walking, keeping moving  PERTINENT HISTORY:   DM with neuropathy, anxiety, asthma, chronic LBP  PRECAUTIONS: None  RED FLAGS: None  WEIGHT BEARING RESTRICTIONS: No  FALLS:  Has patient fallen in last 6 months? 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 Additional history provided by scanning technologist: Patient reports chronic low back pain off and on for 20 years, low back pain radiating down into buttocks bilaterally for 1 month, pain is made worse by sitting for long periods.   EXAM: MRI LUMBAR SPINE WITHOUT CONTRAST   TECHNIQUE: Multiplanar, multisequence MR imaging of the lumbar spine was performed. No intravenous contrast was administered.   COMPARISON:  Lumbar spine radiographs 10/27/2014.   FINDINGS: Segmentation: 5 lumbar vertebrae (correlating with lumbar spine radiographs 10/27/2014).   Alignment: Mild lumbar levocurvature. Trace L2-L3 and L3-L4 grade 1 retrolisthesis.   Vertebrae: Vertebral body height is maintained. No significant marrow edema or focal suspicious osseous lesion. Small L5 vertebral body hemangioma.   Conus medullaris and cauda equina: Conus extends to the L1 level. No signal abnormality within the visualized distal spinal cord.   Paraspinal and other soft tissues: No abnormality identified within included portions of the abdomen/retroperitoneum. Paraspinal soft tissues within normal limits.   Disc levels:   Mild multilevel disc degeneration greatest at L3-L4, L4-L5 and L5-S1.   T12-L1: No  significant disc herniation or stenosis.   L1-L2: No significant disc herniation or stenosis.   L2-L3: Right center posterior annular fissure. Mild disc bulge. Mild facet arthrosis. No significant spinal canal or foraminal stenosis.   L3-L4: Disc bulge with mild endplate spurring. Superimposed small right subarticular disc protrusion at site of posterior annular fissure. Mild facet arthrosis. The disc protrusion results in right subarticular stenosis with encroachment upon the descending right L4  nerve root (series 8, image 24). Central canal patent. Mild bilateral neural foraminal narrowing.   L4-L5: Disc bulge with mild endplate spurring. Superimposed broad-based central disc protrusion at site of posterior annular fissure. Moderate facet arthrosis. The disc protrusion contributes to right greater than left subarticular stenosis with crowding of the descending right L5 nerve root, and possible contact upon the descending left L5 nerve root. Mild narrowing of the central canal. Bilateral neural foraminal narrowing (moderate right, mild left).   L5-S1: Tiny central disc protrusion minimally effacing the ventral thecal sac. Mild facet arthrosis. No significant foraminal narrowing.   IMPRESSION: Lumbar spondylosis as outlined with findings most notably as follows.   At L3-L4, a right subarticular disc protrusion contributes to right subarticular stenosis with encroachment upon the descending right L4 nerve root. Multifactorial mild bilateral neural foraminal narrowing.   At L4-L5, a broad-based central disc protrusion contributes to right greater than left subarticular stenosis with crowding of the descending right L5 nerve root, and possible contact upon the descending left L5 nerve root. Mild central canal narrowing. Multifactorial bilateral neural foraminal narrowing (moderate right, mild left).     Electronically Signed   By: Rockey Childs DO   On: 02/22/2020  10:48  PATIENT SURVEYS:  Modified Oswestry:  MODIFIED OSWESTRY DISABILITY SCALE  Date: 01/20/2024 Score  Pain intensity 2 =  Pain medication provides me with complete relief from pain.  2. Personal care (washing, dressing, etc.) 0 =  I can take care of myself normally without causing increased pain.  3. Lifting 0 = I can lift heavy weights without increased pain.  4. Walking 0 = Pain does not prevent me from walking any distance  5. Sitting 0 =  I can sit in any chair as long as I like.  6. Standing 1 =  I can stand as long as I want but, it increases my pain.  7. Sleeping 0 = Pain does not prevent me from sleeping well.  8. Social Life 0 = My social life is normal and does not increase my pain.  9. Traveling 1 =  I can travel anywhere, but it increases my pain.  10. Employment/ Homemaking 0 = My normal homemaking/job activities do not cause pain.  Total 4/50   Interpretation of scores: Score Category Description  0-20% Minimal Disability The patient can cope with most living activities. Usually no treatment is indicated apart from advice on lifting, sitting and exercise  21-40% Moderate Disability The patient experiences more pain and difficulty with sitting, lifting and standing. Travel and social life are more difficult and they may be disabled from work. Personal care, sexual activity and sleeping are not grossly affected, and the patient can usually be managed by conservative means  41-60% Severe Disability Pain remains the main problem in this group, but activities of daily living are affected. These patients require a detailed investigation  61-80% Crippled Back pain impinges on all aspects of the patients life. Positive intervention is required  81-100% Bed-bound  These patients are either bed-bound or exaggerating their symptoms  Bluford FORBES Zoe DELENA Karon DELENA, et al. Surgery versus conservative management of stable thoracolumbar fracture: the PRESTO feasibility RCT. [place]  ([place]): [employer]; 2021 Nov. [facility] Technology Assessment, No. 25.62.) Appendix 3, Oswestry Disability Index category descriptors. Available from: Findjewelers.cz  Minimally Clinically Important Difference (MCID) = 12.8%  SCREENING FOR RED FLAGS: Bowel or bladder incontinence: No Spinal tumors: No Cauda equina syndrome: No Compression fracture: No Abdominal aneurysm: No  COGNITION:  Overall cognitive status: Within functional limits for tasks assessed    SENSATION: WFL  POSTURE:  No Significant postural limitations; all pelvic landmarks are equal, no assymetries in shoulder height or neck position, normal lumbar lordosis and thoracic kyphosis  PALPATION: Not TTP over the lumbar spine or hips.   Lumbar P/A glides and rotational glides in prone are nonpainful  LUMBAR ROM:   Active  Eval  Flexion To mid shin  Extension 100%  Right lateral flexion 75%  Left lateral flexion 75%  Right rotation 100%  Left rotation 100%  (Blank rows = not tested)  MUSCLE LENGTH: Hamstrings: Right SLR 60 deg; Left SLR = 45 deg Thomas test: Right NT deg; Left approx -8 deg Hamstrings: very tight BLE L>R ITB: NT Piriformis: NT Hip flexors: some tightness on L Quads: NT Heelcord: NT  LOWER EXTREMITY ROM:     Active  Right eval Left eval  Hip flexion    Hip extension    Hip abduction    Hip adduction    Hip internal rotation    Hip external rotation    Knee flexion    Knee extension    Ankle dorsiflexion    Ankle plantarflexion    Ankle inversion    Ankle eversion    (Blank rows = not tested)  LOWER EXTREMITY MMT:    MMT Right eval Left eval  Hip flexion 5 5  Hip extension 5 5  Hip abduction 5 5  Hip adduction 5 5  Hip internal rotation 5 5  Hip external rotation 5 5  Knee flexion 5 5  Knee extension 5 5  Ankle dorsiflexion 5 5  Ankle plantarflexion 5 5  Ankle inversion    Ankle eversion    Great toe extension 3+ 4+   (Blank  rows = not tested) Patient is able to heel walk and toe walk BLE  LUMBAR SPECIAL TESTS:  Straight leg raise test: Negative, Slump test: Negative, SI Compression/distraction test: Negative, FABER test: Negative, Gaenslen's test: Negative, and Thomas test: Negative  FUNCTIONAL TESTS:  NA  GAIT: Distance walked: into clinic 200' Assistive device utilized: None Level of assistance: Complete Independence Gait pattern: WFL Comments: normal gait pattern; no deficits   TODAY'S TREATMENT:   SELF CARE: Provided education on PT POC progression.    PATIENT EDUCATION:  Education details: PT eval findings, anticipated POC, and initial HEP  Person educated: Patient Education method: Explanation, Demonstration, Verbal cues, Tactile cues, and Handouts Education comprehension: verbalized understanding, verbal cues required, tactile cues required, and needs further education  HOME EXERCISE PROGRAM: Access Code: [email or web address] URL: [email or web address] Date: 01/20/2024 Prepared by: Garnette Montclair  Exercises - Plank on Knees  - 1 x daily - 7 x weekly - 1 sets - 10 reps - 5 sec hold - Standard Plank  - 1 x daily - 7 x weekly - 1 sets - 10 reps - 3 sec hold - Side Plank on Knees  - 1 x daily - 7 x weekly - 3 sets - 10 reps - 5 sec hold - Side Plank on Elbow  - 1 x daily - 7 x weekly - 1 sets - 10 reps - 3 sec hold - Anti-Rotation Sidestepping with Resistance  - 1 x daily - 7 x weekly - 3 sets - 10 reps - Bird Dog  - 1 x daily - 7 x weekly - 3 sets - 10 reps - Standing Hamstring Stretch with Step  - 1 x daily -  7 x weekly - 1 sets - 2 reps - 1 min hold - Supine Hamstring Stretch with Doorway  - 1 x daily - 7 x weekly - 1 sets - 2 reps - 1 min hold   ASSESSMENT:  CLINICAL IMPRESSION: Joseph Jackson is a 58 y.o. male who was referred to physical therapy for evaluation and treatment for LBP.  MRI shows lumbar disc protrusion at L3/4 with encroachment on the R L4 nerve root as well  as disc bulging at L4/5 with central canal and B foraminal narrowing R >L.  Patient reports onset of low back pain beginning 2 weeks ago after lifting/carrying heavy radio equipment batteries weighing approx 100 lbs.  Pain was worse with prolonged sitting or standing.   However, he has been doing some of his old PT exercises from years ago and his symptoms have nearly resolved.  His pain over the last 3 days is only 1/10 worst and is 0/10 today.     On Modified Oswestry patient scored 4/50 demonstrating minimal to no disability.  He has very tight hamstrings which likely are affecting his LBP and he is provided with a home exercise program for stretching this today.   His job also requires heavy lifting so his HEP includes core stabilization and strengthening.   It is not likely that he is going to need to return to PT.  However, if he has future problems, he is advised to call us .   He lives in [place] and our clinic is 30 min from his home.   He is given names of 6-7 PT clinics that are much closer to him in [place] that he may go to PRN.   He is going to try to do his hEP on his own and return to work and will call if future problems arise.  OBJECTIVE IMPAIRMENTS: impaired flexibility.   ACTIVITY LIMITATIONS: none  PARTICIPATION LIMITATIONS: occupation  PERSONAL FACTORS: Age, Fitness, Time since onset of injury/illness/exacerbation, and 1-2 comorbidities: DM, neuropathy, asthma, anxiety are also affecting patient's functional outcome.   REHAB POTENTIAL: Good  CLINICAL DECISION MAKING: Evolving/moderate complexity  EVALUATION COMPLEXITY: Moderate   GOALS: Goals reviewed with patient? Yes  SHORT TERM GOALS: Target date: 02/03/2024   Patient will be independent with initial HEP to improve outcomes and carryover.  Baseline: 100% PT assist required for correct completion Goal status: MET   LONG TERM GOALS: Target date: 02/17/2024   Patient will be independent with ongoing/advanced HEP  for self-management at home.  Baseline: no advanced HEP yet Goal status: INITIAL  2.  Patient will report 50-75% improvement in low back pain to improve QOL.  Baseline: 1/10 worst Goal status: INITIAL  3.  Patient will demonstrate full pain free lumbar ROM to perform ADLs.   Baseline: Refer to above lumbar ROM table Goal status: INITIAL   4. Patient will report </= 0% on Modified Oswestry (MCID = 12%) to demonstrate improved functional ability with decreased pain interference. Baseline: 4/50 Goal status: INITIAL  PLAN:  PT FREQUENCY: 1-2x/week  PT DURATION: 8 weeks  PLANNED INTERVENTIONS: 97164- PT Re-evaluation, 97750- Physical Performance Testing, 97110-Therapeutic exercises, 97530- Therapeutic activity, [id number]- Neuromuscular re-education, 97535- Self Care, 02859- Manual therapy, G0283- Electrical stimulation (unattended), 97016- Vasopneumatic device, [id number]- Ultrasound, [id number]- Traction (mechanical), [id number]- Ionotophoresis 4mg /ml Dexamethasone , 79439 (1-2 muscles), 20561 (3+ muscles)- Dry Needling, Patient/Family education, Taping, Joint mobilization, and Spinal mobilization  PLAN FOR NEXT SESSION: Patient will return PRN.  Otherwise this will be an evaluation only visit.  Will leave chart open 30 days if he needs to return  PHYSICAL THERAPY DISCHARGE SUMMARY  Visits from Start of Care: 1  Current functional level related to goals / functional outcomes: UNCHANGED.  Only seen for 1 visit.  Did not return   Remaining deficits: As per evaluation note above   Education / Equipment: Initial HEP was provided to the patient   Patient agrees to discharge. Patient goals were not met. Patient is being discharged due to not returning since the last visit.   Stacee Earp, PT 01/20/2024, 3:52 PM  "

## 2024-01-20 ENCOUNTER — Other Ambulatory Visit: Payer: Self-pay

## 2024-01-20 ENCOUNTER — Telehealth: Payer: Self-pay

## 2024-01-20 ENCOUNTER — Ambulatory Visit

## 2024-01-20 ENCOUNTER — Encounter: Payer: Self-pay | Admitting: Rehabilitation

## 2024-01-20 ENCOUNTER — Ambulatory Visit: Admitting: Rehabilitation

## 2024-01-20 VITALS — BP 90/60 | HR 59 | Temp 98.6°F | Ht 73.5 in | Wt 214.0 lb

## 2024-01-20 DIAGNOSIS — E1140 Type 2 diabetes mellitus with diabetic neuropathy, unspecified: Secondary | ICD-10-CM | POA: Diagnosis not present

## 2024-01-20 DIAGNOSIS — Z7984 Long term (current) use of oral hypoglycemic drugs: Secondary | ICD-10-CM

## 2024-01-20 DIAGNOSIS — Z23 Encounter for immunization: Secondary | ICD-10-CM | POA: Diagnosis not present

## 2024-01-20 DIAGNOSIS — G8929 Other chronic pain: Secondary | ICD-10-CM | POA: Insufficient documentation

## 2024-01-20 DIAGNOSIS — I10 Essential (primary) hypertension: Secondary | ICD-10-CM

## 2024-01-20 DIAGNOSIS — M6281 Muscle weakness (generalized): Secondary | ICD-10-CM | POA: Insufficient documentation

## 2024-01-20 DIAGNOSIS — M5459 Other low back pain: Secondary | ICD-10-CM | POA: Diagnosis not present

## 2024-01-20 DIAGNOSIS — M545 Low back pain, unspecified: Secondary | ICD-10-CM | POA: Insufficient documentation

## 2024-01-20 LAB — BASIC METABOLIC PANEL WITH GFR
BUN: 16 mg/dL (ref 6–23)
CO2: 23 meq/L (ref 19–32)
Calcium: 10 mg/dL (ref 8.4–10.5)
Chloride: 104 meq/L (ref 96–112)
Creatinine, Ser: 0.71 mg/dL (ref 0.40–1.50)
GFR: 100.45 mL/min (ref >60.00)
Glucose, Bld: 125 mg/dL — ABNORMAL HIGH (ref 70–99)
Potassium: 4.1 meq/L (ref 3.5–5.1)
Sodium: 140 meq/L (ref 135–145)

## 2024-01-20 LAB — MICROALBUMIN / CREATININE URINE RATIO
Creatinine,U: 95.7 mg/dL
Microalb Creat Ratio: 9.4 mg/g (ref 0.0–30.0)
Microalb, Ur: 0.9 mg/dL (ref 0.0–1.9)

## 2024-01-20 LAB — HM DIABETES FOOT EXAM

## 2024-01-20 NOTE — Patient Instructions (Addendum)
 Thank you for visiting [facility] today! Here's what we talked about: - Metfirmin 2 tablets twice a day for total of 2000mg  - Jardiuance 2 tablets once a day for total of 20mg  - Set up for diabetic eye exam - Ask pharmacy for COVID vaccine

## 2024-01-20 NOTE — Telephone Encounter (Signed)
 Opened in error

## 2024-01-20 NOTE — Progress Notes (Signed)
 Subjective:    Patient ID: Joseph Jackson, male    DOB: [birth year], 58 y.o.   MRN: [id number]   Joseph Jackson is a very pleasant 58 y.o. male who presents today for f/u chronic conditions.  -Denies polydipsia, polyuria, weight loss, fatigue or any LE wounds.   Current A1c: 7.8 Last A1c: 7.6 Current meds: Metformin  and Jardiance ,  Neuropathy: Yes Nephropathy: No Retinopathy: No   Urine microalbumin: Needs Eye exam: UTD Foot exam: Needs Ace-i/ARB: Yes Statin: Yes  Influenza vaccine: UTD Pneumococcal vaccine: Need Diet: Cut back on sugary foods and drinks Weight concerns: Walking 5 times week  Review of Systems  All other systems reviewed and are negative.       Allergies  Allergen Reactions   Aspirin Swelling    Bufferin lips, hives   Other Other (See Comments)    Ragweed, pollen, pine/ nasal congestion and wheezing    Current Outpatient Medications on File Prior to Visit  Medication Sig Dispense Refill   albuterol  (VENTOLIN  HFA) 108 (90 Base) MCG/ACT inhaler TAKE 2 PUFFS BY MOUTH EVERY 6 HOURS AS NEEDED FOR WHEEZE OR SHORTNESS OF BREATH 8.5 each 1   B Complex-Biotin-FA (B-COMPLEX PO) Take 1 tablet by mouth daily.     calcipotriene  (DOVONOX) 0.005 % cream Apply 1 Application topically 2 (two) times daily as needed (psoriasis). 60 g 1   Cholecalciferol (VITAMIN D-3) 125 MCG (5000 UT) TABS Take 1 tablet by mouth once a week.     clobetasol  ointment (TEMOVATE ) 0.05 % Apply 1 Application topically 2 (two) times daily as needed (psoriasis). 30 g 1   empagliflozin  (JARDIANCE ) 10 MG TABS tablet Take 2 tablets (20 mg total) by mouth daily.     fluticasone  (FLONASE ) 50 MCG/ACT nasal spray Place 2 sprays into the nose daily.     gabapentin  (NEURONTIN ) 300 MG capsule Take 3 capsules (900 mg total) by mouth at bedtime. 270 capsule 3   glucose blood (ONETOUCH ULTRA) test strip USE CHECK BLOOD SUGAR ONCE DAILY. 100 strip 3   Lancets (ONETOUCH ULTRASOFT) lancets Use as  instructed 100 each 0   levocetirizine (XYZAL) 5 MG tablet Take 5 mg by mouth daily.     lisinopril  (ZESTRIL ) 10 MG tablet Take 1 tablet (10 mg total) by mouth daily for 30 doses. 30 tablet 1   metFORMIN  (GLUCOPHAGE -XR) 500 MG 24 hr tablet Take 2 tablets (1,000 mg total) by mouth 2 (two) times daily with a meal.     olopatadine  (PATANOL) 0.1 % ophthalmic solution Place 1 drop into both eyes 2 (two) times daily. (Patient taking differently: Place 1 drop into both eyes daily.) 5 mL 5   Omega-3 Fatty Acids (FISH OIL) 1200 MG CAPS Take 2 capsules by mouth at bedtime.     rosuvastatin  (CRESTOR ) 10 MG tablet Take 2 tablets (20 mg total) by mouth daily.     vitamin E 180 MG (400 UNITS) capsule Take 400 Units by mouth daily.     No current facility-administered medications on file prior to visit.    BP 90/60 (BP Location: Left Arm, Patient Position: Sitting, Cuff Size: Large)   Pulse (!) 59   Temp 98.6 F (37 C) (Oral)   Ht 6' 1.5 (1.867 m)   Wt 214 lb (97.1 kg)   SpO2 97%   BMI 27.85 kg/m   Objective:    Physical Exam Vitals and nursing note reviewed.  Constitutional:      Appearance: Normal appearance.  Cardiovascular:  Pulses:          Dorsalis pedis pulses are 2+ on the right side and 2+ on the left side.       Posterior tibial pulses are 2+ on the right side and 2+ on the left side.  Feet:     Right foot:     Protective Sensation: 10 sites tested.  7 sites sensed.     Skin integrity: Skin integrity normal.     Toenail Condition: Right toenails are normal.     Left foot:     Protective Sensation: 10 sites tested.  6 sites sensed.     Skin integrity: Skin integrity normal.     Toenail Condition: Left toenails are normal.  Neurological:     Mental Status: He is alert.            Assessment & Plan:   1. Controlled type 2 diabetes mellitus with diabetic neuropathy (HCC) (Primary) 2. Need for pneumococcal vaccination Most recent A1c was 7.8, above goal.  Adjusted  metformin  and Jardiance  doses as below, plan to repeat in 3 months. Pneumonia vaccine given this visit, urine microalbumin ordered. Diabetic foot exam completed this visit, expectedly abnormal given history of neuropathy. Associated risk factors of HLD and BP well-controlled.  - Microalbumin/Creatinine Ratio, Urine - Pneumococcal conjugate vaccine 20-valent (Prevnar 20) - Continue Jardiance  20 mg daily - Continue metformin  1000 mg twice daily  3. Primary hypertension Patient was initially hypotensive this visit, asymptomatic, repeat blood pressure was the same.  He reports home BP readings of around 120s over 80s.  Will not change medication regimen at this time, counseled him to continue home monitoring.  -Continue lisinopril  10 mg daily  4. Need for COVID-19 vaccine Prescription sent to pharmacy - MODERNA-SPIKEVAX Vaccine 57yrs & up Fall Seasonal Vaccine; Future   Return in about 3 weeks (around 02/10/2024) for CPE. Then 25mo for diabetes  Joseph MARLA Burkes, MD  01/20/24

## 2024-01-23 ENCOUNTER — Other Ambulatory Visit: Payer: Self-pay

## 2024-01-23 DIAGNOSIS — S300XXA Contusion of lower back and pelvis, initial encounter: Secondary | ICD-10-CM | POA: Diagnosis not present

## 2024-01-23 DIAGNOSIS — M47816 Spondylosis without myelopathy or radiculopathy, lumbar region: Secondary | ICD-10-CM | POA: Diagnosis not present

## 2024-01-23 DIAGNOSIS — W08XXXA Fall from other furniture, initial encounter: Secondary | ICD-10-CM | POA: Diagnosis not present

## 2024-01-23 DIAGNOSIS — I10 Essential (primary) hypertension: Secondary | ICD-10-CM

## 2024-01-27 DIAGNOSIS — E119 Type 2 diabetes mellitus without complications: Secondary | ICD-10-CM

## 2024-01-28 MED ORDER — EMPAGLIFLOZIN 25 MG PO TABS: 25.0000 mg | ORAL_TABLET | Freq: Every day | ORAL | 1 refills | Status: AC

## 2024-02-19 MED ORDER — ROSUVASTATIN CALCIUM 20 MG PO TABS: 20.0000 mg | ORAL_TABLET | Freq: Every day | ORAL | 3 refills | Status: AC

## 2024-02-24 ENCOUNTER — Ambulatory Visit

## 2024-02-24 VITALS — BP 118/80 | HR 64 | Temp 97.9°F | Ht 73.5 in | Wt 229.0 lb

## 2024-02-24 DIAGNOSIS — Z125 Encounter for screening for malignant neoplasm of prostate: Secondary | ICD-10-CM | POA: Diagnosis not present

## 2024-02-24 DIAGNOSIS — I10 Essential (primary) hypertension: Secondary | ICD-10-CM

## 2024-02-24 DIAGNOSIS — Z0000 Encounter for general adult medical examination without abnormal findings: Secondary | ICD-10-CM | POA: Diagnosis not present

## 2024-02-24 LAB — BASIC METABOLIC PANEL WITH GFR
BUN: 17 mg/dL (ref 6–23)
CO2: 26 meq/L (ref 19–32)
Calcium: 9.9 mg/dL (ref 8.4–10.5)
Chloride: 103 meq/L (ref 96–112)
Creatinine, Ser: 0.82 mg/dL (ref 0.40–1.50)
GFR: 96.11 mL/min (ref >60.00)
Glucose, Bld: 136 mg/dL — ABNORMAL HIGH (ref 70–99)
Potassium: 4.1 meq/L (ref 3.5–5.1)
Sodium: 139 meq/L (ref 135–145)

## 2024-02-24 LAB — PSA: PSA: 0.5 ng/mL (ref 0.10–4.00)

## 2024-02-24 MED ORDER — BLOOD PRESSURE MONITOR AUTOMAT DEVI: 1.0000 | Freq: Once | 0 refills | Status: AC

## 2024-02-24 NOTE — Patient Instructions (Signed)
 Thank you for visiting [facility] today! Here's what we talked about: - Make dental and diabetic eye appoitnments - [facility] at [facility]  for BP cuff

## 2024-02-24 NOTE — Progress Notes (Signed)
 Assessment & Plan:   This visit was conducted in person. The patient gave informed consent to the use of [employer]bridge AI technology to record the contents of the encounter as documented below.  Assessment and Plan Assessment & Plan Adult Wellness Visit Routine wellness visit with no acute concerns. Discussed PSA screening, weight management, and lifestyle modifications. Colonoscopy: Last colonoscopy from Mar 18, 2019 revealed precancerous polyps, repeat recommended in 7 years Immunizations: UTD Diet and Exercise: walking nightly about 30 mins, reducing sugar intake,   - Ordered PSA test for prostate cancer screening. - Encouraged scheduling dental and diabetic eye appointments. - Encouraged continued lifestyle modifications for weight management.  Primary hypertension Primary hypertension, well-controlled with lisinopril . Discussed home blood pressure monitoring.  - Sent prescription for blood pressure cuff. - Provided information on [facility] for blood pressure cuff, if not covered by insurance. - Continue lisinopril  10 mg daily - BMP ordered given recent start of lisinopril  a few weeks ago.    Follow-up: Return in about 4 weeks (around 03/23/2024) for Diabeic visit.        Subjective:   Patient ID: Joseph Jackson, male    DOB: Dec 25, 1965  Age: 58 y.o. MRN: [id number]  Patient Care Team: Bennett Reuben POUR, MD as PCP - General (Family Medicine)   CC:  Chief Complaint  Patient presents with   1 Month Follow-up   Joseph Jackson from a small step ladder 01-24-24. Went to UC. Had a contusion on his back.  Has not been able to check BP at home because his meter broke.      [name] is a 58 y.o. male who presents today for a complete physical exam.   Discussed the use of AI scribe software for clinical note transcription with the patient, who gave verbal consent to proceed.  History of Present Illness Joseph Jackson is a 58 year old male who presents  for an annual physical exam.  He has a history of precancerous polyps found and removed during a colonoscopy in 18-Mar-2019, with the next colonoscopy scheduled for 2026-03-17.  He is currently taking lisinopril  10 mg daily, which was started on January 20, 2024, after switching from triamterene . He continues all other medications as prescribed and has not started any new supplements.  He experienced a fall from a small step ladder on January 24, 2024, resulting in a large bruise on his lower back from landing on a work radio. The bruise has resolved, but the area remains tender.  His exercise routine is more consistent now that the weather is cooler, walking around his neighborhood for about 30 minutes each night after work, or 45 minutes at a nearby track. He is working on improving his diet, focusing on reducing sugar intake and increasing fiber, using foods like shredded wheat and oatmeal, and sweetening with artificial sweeteners.  He has never smoked and his immunizations, including flu, shingles, pneumonia, and COVID, are up to date.  He uses a program to log his weight with a calibrated scale at home, noting that his weight is more consistent when measured unclothed.  He experiences normal anxiety related to work and current events but denies any mood issues or depression. No issues with sleep, urination, or bowel movements, which occur two to three times daily.  He has a family history of ovarian cancer in his mother, who passed away at age 91 in 03/17/2008.   Depression Screening;    02/24/2024    9:50  AM 01/20/2024    9:01 AM 12/30/2023   12:20 PM 12/30/2023   12:19 PM 12/13/2022    3:40 PM 12/11/2021    3:12 PM 04/29/2020    7:58 AM  PHQ 2/9 Scores  PHQ - 2 Score 0 0 2 0 0 0 0  PHQ- 9 Score   6          Data saved with a previous flowsheet row definition     Anxiety Screening:     No data to display           ROS: Negative unless specifically indicated above in HPI.        Objective:     BP 118/80 (BP Location: Left Arm, Patient Position: Sitting, Cuff Size: Large)   Pulse 64   Temp 97.9 F (36.6 C) (Oral)   Ht 6' 1.5 (1.867 m)   Wt 229 lb (103.9 kg)   SpO2 96%   BMI 29.80 kg/m    Physical Exam GENERAL: Alert, cooperative, well developed, no acute distress. HEAD: Normocephalic atraumatic. EYES: Extraocular movements intact bilaterally, pupils round, equal and reactive to light bilaterally, conjunctivae normal bilaterally. EARS: Tympanic membrane, ear canal and external ear normal bilaterally. NOSE: No congestion or rhinorrhea, mucous membranes are moist. THROAT: No oropharyngeal exudate or posterior oropharyngeal erythema. CARDIOVASCULAR: Normal heart rate and rhythm, S1 and S2 normal without murmurs. CHEST: Clear to auscultation bilaterally, no wheezes, rhonchi, or crackles. ABDOMEN: Soft, non-tender, non-distended, without organomegaly, normal bowel sounds. EXTREMITIES: No cyanosis or edema. NEUROLOGICAL: Oriented to person, place and time, no gait abnormalities, moves all extremities without gross motor or sensory deficit, cranial nerves intact, oral cavity normal. NECK: Neck and thyroid  normal.        [name] K Georgine Wiltse, MD  02/24/24    Contains text generated by [employer]bridge AI Software.

## 2024-02-26 ENCOUNTER — Ambulatory Visit: Payer: Self-pay

## 2024-03-21 ENCOUNTER — Other Ambulatory Visit: Payer: Self-pay | Admitting: Family

## 2024-03-23 ENCOUNTER — Other Ambulatory Visit: Payer: Self-pay

## 2024-03-23 ENCOUNTER — Ambulatory Visit

## 2024-03-23 VITALS — BP 102/66 | HR 67 | Temp 97.9°F | Ht 73.5 in | Wt 220.0 lb

## 2024-03-23 DIAGNOSIS — E1140 Type 2 diabetes mellitus with diabetic neuropathy, unspecified: Secondary | ICD-10-CM | POA: Diagnosis not present

## 2024-03-23 DIAGNOSIS — I10 Essential (primary) hypertension: Secondary | ICD-10-CM

## 2024-03-23 DIAGNOSIS — E785 Hyperlipidemia, unspecified: Secondary | ICD-10-CM

## 2024-03-23 DIAGNOSIS — Z0000 Encounter for general adult medical examination without abnormal findings: Secondary | ICD-10-CM | POA: Diagnosis not present

## 2024-03-23 DIAGNOSIS — Z7984 Long term (current) use of oral hypoglycemic drugs: Secondary | ICD-10-CM | POA: Diagnosis not present

## 2024-03-23 LAB — POCT GLYCOSYLATED HEMOGLOBIN (HGB A1C): Hemoglobin A1C: 6.9 % — AB (ref 4.0–5.6)

## 2024-03-23 MED ORDER — BLOOD PRESSURE MONITOR AUTOMAT DEVI
0 refills | Status: AC
  Filled 2024-03-23 – 2024-04-01 (×2): qty 1, 30d supply, fill #0

## 2024-03-23 NOTE — Patient Instructions (Signed)
 Thank you for visiting [facility] today! Here's what we talked about: - Need to set up dentist and visions appointments

## 2024-03-23 NOTE — Progress Notes (Unsigned)
 Assessment & Plan:   This visit was conducted in person. The patient gave informed consent to the use of Abridge AI technology to record the contents of the encounter as documented below.  Assessment & Plan 1. Physical exam, annual (Primary) Age-appropriate screening, counseling and vaccines discussed with patient. Healthy diet and exercise recommendations given.  I have personally reviewed and have noted: 1. The patient's medical and social history 2. Their use of alcohol, tobacco or illicit drugs 3. Their current medications and supplements 4. The patient's functional ability including ADL's, fall risks, home safety risks and hearing or visual impairment. 5. Diet and physical activities 6. Evidence for depression or mood disorder   Colonoscopy: Due in 2027, hx of precancerous polyp in 2020 Labs: PSA, HIV, Hep C, Lipid and A1c UTD Immunizations: UTD Diet and Exercise: A bit difficult with holidays but trying to eat more veggies, walking 3-4 times weekly.  Sleep and mood: No concerns Dental and vision: Needs   Health counselling given: Healthy diet and exercise counselling given DM  2. Controlled type 2 diabetes mellitus with diabetic neuropathy (HCC) A1c well controlled at 6.9, no changes to regimen at this time.  - Continue current doses of metformin  and Jardiance .  3. Primary hypertension Normotensive this visit, BP well-controlled, no changes to regimen.  Sent BP cuff per patient's request for monitoring.  - Blood Pressure Monitoring (BLOOD PRESSURE MONITOR AUTOMAT) DEVI; Use to check BP daily  Dispense: 1 each; Refill: 0 - Continue current dose of lisinopril   4. Hyperlipidemia, unspecified hyperlipidemia type Well-controlled on current regimen, no changes.  - Continue current dose of Crestor  daily   Follow-up: Return in about 29 days (around 04/21/2024) for Diabetic visit then 03/23/2025 for CPE with fasting labs 1 wk prior.        Subjective:   Patient  ID: Joseph Jackson, male    DOB: 10-02-1965  Age: 58 y.o. MRN: [id number]  Patient Care Team: Bennett Reuben POUR, MD as PCP - General (Family Medicine)   CC:  Chief Complaint  Patient presents with   Diabetes    Checks his fasting blood sugar about once a week.    Joseph Jackson is a 58 y.o. male here for annual physical and f/u on chronic conditions, see assessment and plan for further details   Discussed the use of AI scribe software for clinical note transcription with the patient, who gave verbal consent to proceed.  History of Present Illness Says he has been exercising more, trying to moderate sugary foods even though its the holidays. Has been taking all his meds, No acute complaints    Depression Screening;    03/23/2024    4:14 PM 02/24/2024    9:50 AM 01/20/2024    9:01 AM 12/30/2023   12:20 PM 12/30/2023   12:19 PM 12/13/2022    3:40 PM 12/11/2021    3:12 PM  PHQ 2/9 Scores  PHQ - 2 Score 0 0 0 2 0 0 0  PHQ- 9 Score    6         Data saved with a previous flowsheet row definition     Anxiety Screening:     No data to display           ROS: Negative unless specifically indicated above in HPI.       Objective:     BP 102/66 (BP Location: Left Arm, Patient Position: Sitting, Cuff Size: Large)   Pulse 67   Temp  97.9 F (36.6 C) (Oral)   Ht 6' 1.5 (1.867 m)   Wt 220 lb (99.8 kg)   SpO2 97%   BMI 28.63 kg/m    Physical Exam  Physical Exam Vitals and nursing note reviewed.  Constitutional:      Appearance: Normal appearance.  HENT:     Head: Normocephalic and atraumatic.     Right Ear: Tympanic membrane, ear canal and external ear normal.     Left Ear: Tympanic membrane, ear canal and external ear normal.     Nose: No congestion or rhinorrhea.     Mouth/Throat:     Mouth: Mucous membranes are moist.     Pharynx: No oropharyngeal exudate or posterior oropharyngeal erythema.  Eyes:     Extraocular Movements: Extraocular movements intact.      Conjunctiva/sclera: Conjunctivae normal.     Pupils: Pupils are equal, round, and reactive to light.  Cardiovascular:     Rate and Rhythm: Normal rate and regular rhythm.     Heart sounds: No murmur heard. Pulmonary:     Effort: Pulmonary effort is normal.     Breath sounds: Normal breath sounds.  Abdominal:     General: Abdomen is flat. Bowel sounds are normal.     Palpations: Abdomen is soft.  Musculoskeletal:     Right lower leg: No edema.     Left lower leg: No edema.  Skin:    General: Skin is warm.  Neurological:     Mental Status: He is alert. Mental status is at baseline.  Psychiatric:        Mood and Affect: Mood normal.        Behavior: Behavior normal.           Delesia Martinek K Hertha Gergen, MD  03/23/24    Contains text generated by [employer]bridge AI Software.

## 2024-03-25 ENCOUNTER — Ambulatory Visit: Payer: Self-pay

## 2024-03-28 NOTE — Addendum Note (Signed)
 Addended by: BENNETT REUBEN POUR on: 03/28/2024 09:53 PM   Modules accepted: Level of Service

## 2024-04-01 ENCOUNTER — Other Ambulatory Visit: Payer: Self-pay
